# Patient Record
Sex: Female | Born: 1950 | Race: White | Hispanic: No | Marital: Married | Smoking: Former smoker
Health system: Southern US, Community
[De-identification: ages and names within clinical notes are randomized; demographics above are authoritative.]

## PROBLEM LIST (undated history)

## (undated) DIAGNOSIS — D689 Coagulation defect, unspecified: Secondary | ICD-10-CM

## (undated) DIAGNOSIS — T7840XA Allergy, unspecified, initial encounter: Secondary | ICD-10-CM

## (undated) DIAGNOSIS — E785 Hyperlipidemia, unspecified: Secondary | ICD-10-CM

## (undated) DIAGNOSIS — D0510 Intraductal carcinoma in situ of unspecified breast: Secondary | ICD-10-CM

## (undated) DIAGNOSIS — J189 Pneumonia, unspecified organism: Secondary | ICD-10-CM

## (undated) DIAGNOSIS — C9110 Chronic lymphocytic leukemia of B-cell type not having achieved remission: Secondary | ICD-10-CM

## (undated) DIAGNOSIS — M1990 Unspecified osteoarthritis, unspecified site: Secondary | ICD-10-CM

## (undated) DIAGNOSIS — C50919 Malignant neoplasm of unspecified site of unspecified female breast: Secondary | ICD-10-CM

## (undated) DIAGNOSIS — K219 Gastro-esophageal reflux disease without esophagitis: Secondary | ICD-10-CM

## (undated) HISTORY — DX: Allergy, unspecified, initial encounter: T78.40XA

## (undated) HISTORY — DX: Coagulation defect, unspecified: D68.9

## (undated) HISTORY — PX: CHOLECYSTECTOMY: SHX55

## (undated) HISTORY — PX: BILATERAL CARPAL TUNNEL RELEASE: SHX6508

## (undated) HISTORY — DX: Malignant neoplasm of unspecified site of unspecified female breast: C50.919

## (undated) HISTORY — DX: Chronic lymphocytic leukemia of B-cell type not having achieved remission: C91.10

## (undated) HISTORY — DX: Gastro-esophageal reflux disease without esophagitis: K21.9

## (undated) HISTORY — DX: Intraductal carcinoma in situ of unspecified breast: D05.10

## (undated) HISTORY — PX: EYE SURGERY: SHX253

## (undated) HISTORY — PX: BREAST LUMPECTOMY: SHX2

## (undated) HISTORY — PX: HERNIA REPAIR: SHX51

## (undated) HISTORY — DX: Hyperlipidemia, unspecified: E78.5

---

## 1977-09-15 HISTORY — PX: THYROID SURGERY: SHX805

## 2001-09-15 HISTORY — PX: GALLBLADDER SURGERY: SHX652

## 2002-09-15 HISTORY — PX: CARDIAC CATHETERIZATION: SHX172

## 2003-11-09 ENCOUNTER — Other Ambulatory Visit: Admission: RE | Admit: 2003-11-09 | Discharge: 2003-11-09 | Payer: Self-pay | Admitting: Obstetrics and Gynecology

## 2004-02-14 ENCOUNTER — Ambulatory Visit (HOSPITAL_COMMUNITY): Admission: RE | Admit: 2004-02-14 | Discharge: 2004-02-14 | Payer: Self-pay | Admitting: Gastroenterology

## 2004-02-14 ENCOUNTER — Encounter (INDEPENDENT_AMBULATORY_CARE_PROVIDER_SITE_OTHER): Payer: Self-pay | Admitting: Specialist

## 2004-09-15 DIAGNOSIS — D689 Coagulation defect, unspecified: Secondary | ICD-10-CM

## 2004-09-15 HISTORY — PX: KNEE ARTHROSCOPY: SUR90

## 2004-09-15 HISTORY — DX: Coagulation defect, unspecified: D68.9

## 2004-12-11 ENCOUNTER — Other Ambulatory Visit: Admission: RE | Admit: 2004-12-11 | Discharge: 2004-12-11 | Payer: Self-pay | Admitting: Obstetrics and Gynecology

## 2007-03-31 ENCOUNTER — Encounter (INDEPENDENT_AMBULATORY_CARE_PROVIDER_SITE_OTHER): Payer: Self-pay | Admitting: Gastroenterology

## 2007-03-31 ENCOUNTER — Ambulatory Visit (HOSPITAL_COMMUNITY): Admission: RE | Admit: 2007-03-31 | Discharge: 2007-03-31 | Payer: Self-pay | Admitting: Gastroenterology

## 2008-03-10 ENCOUNTER — Encounter: Admission: RE | Admit: 2008-03-10 | Discharge: 2008-03-10 | Payer: Self-pay | Admitting: Surgery

## 2008-04-20 ENCOUNTER — Ambulatory Visit (HOSPITAL_COMMUNITY): Admission: RE | Admit: 2008-04-20 | Discharge: 2008-04-20 | Payer: Self-pay | Admitting: Surgery

## 2009-04-17 ENCOUNTER — Encounter: Admission: RE | Admit: 2009-04-17 | Discharge: 2009-04-17 | Payer: Self-pay | Admitting: Obstetrics and Gynecology

## 2009-05-27 DIAGNOSIS — C9110 Chronic lymphocytic leukemia of B-cell type not having achieved remission: Secondary | ICD-10-CM | POA: Insufficient documentation

## 2009-06-08 ENCOUNTER — Encounter: Admission: RE | Admit: 2009-06-08 | Discharge: 2009-06-08 | Payer: Self-pay | Admitting: Surgery

## 2010-04-24 ENCOUNTER — Encounter: Admission: RE | Admit: 2010-04-24 | Discharge: 2010-04-24 | Payer: Self-pay | Admitting: Obstetrics and Gynecology

## 2010-04-26 ENCOUNTER — Encounter: Admission: RE | Admit: 2010-04-26 | Discharge: 2010-04-26 | Payer: Self-pay | Admitting: Obstetrics and Gynecology

## 2010-05-03 ENCOUNTER — Ambulatory Visit: Payer: Self-pay | Admitting: Oncology

## 2010-05-06 LAB — COMPREHENSIVE METABOLIC PANEL
ALT: 28 U/L (ref 0–35)
Albumin: 4.2 g/dL (ref 3.5–5.2)
BUN: 11 mg/dL (ref 6–23)
Chloride: 109 mEq/L (ref 96–112)
Creatinine, Ser: 0.8 mg/dL (ref 0.40–1.20)
Glucose, Bld: 110 mg/dL — ABNORMAL HIGH (ref 70–99)
Sodium: 142 mEq/L (ref 135–145)
Total Protein: 7 g/dL (ref 6.0–8.3)

## 2010-05-06 LAB — CBC WITH DIFFERENTIAL/PLATELET
EOS%: 2.1 % (ref 0.0–7.0)
HGB: 13.8 g/dL (ref 11.6–15.9)
LYMPH%: 70.7 % — ABNORMAL HIGH (ref 14.0–49.7)
MONO%: 4.4 % (ref 0.0–14.0)
NEUT#: 3.9 10*3/uL (ref 1.5–6.5)
NEUT%: 22.3 % — ABNORMAL LOW (ref 38.4–76.8)

## 2010-05-06 LAB — LACTATE DEHYDROGENASE: LDH: 108 U/L (ref 94–250)

## 2010-05-06 LAB — MORPHOLOGY: PLT EST: ADEQUATE

## 2010-05-06 LAB — CHCC SMEAR

## 2010-05-07 LAB — IGG, IGA, IGM: IgG (Immunoglobin G), Serum: 448 mg/dL — ABNORMAL LOW (ref 694–1618)

## 2010-05-10 ENCOUNTER — Ambulatory Visit (HOSPITAL_COMMUNITY): Admission: RE | Admit: 2010-05-10 | Discharge: 2010-05-10 | Payer: Self-pay | Admitting: Oncology

## 2010-06-27 ENCOUNTER — Ambulatory Visit: Payer: Self-pay | Admitting: Oncology

## 2010-07-01 ENCOUNTER — Other Ambulatory Visit: Admission: RE | Admit: 2010-07-01 | Discharge: 2010-07-01 | Payer: Self-pay | Admitting: Oncology

## 2010-07-01 LAB — CBC WITH DIFFERENTIAL/PLATELET
Basophils Absolute: 0.1 10*3/uL (ref 0.0–0.1)
Eosinophils Absolute: 0.5 10*3/uL (ref 0.0–0.5)
LYMPH%: 76.7 % — ABNORMAL HIGH (ref 14.0–49.7)
MCH: 31.3 pg (ref 25.1–34.0)
MCV: 91.5 fL (ref 79.5–101.0)
MONO#: 0.7 10*3/uL (ref 0.1–0.9)
MONO%: 3.7 % (ref 0.0–14.0)
NEUT#: 3 10*3/uL (ref 1.5–6.5)
NEUT%: 16.6 % — ABNORMAL LOW (ref 38.4–76.8)
RBC: 4.25 10*6/uL (ref 3.70–5.45)
RDW: 13.5 % (ref 11.2–14.5)
lymph#: 13.9 10*3/uL — ABNORMAL HIGH (ref 0.9–3.3)

## 2010-09-12 ENCOUNTER — Ambulatory Visit: Payer: Self-pay | Admitting: Oncology

## 2010-09-18 LAB — COMPREHENSIVE METABOLIC PANEL
ALT: 38 U/L — ABNORMAL HIGH (ref 0–35)
AST: 25 U/L (ref 0–37)
Albumin: 5 g/dL (ref 3.5–5.2)
Alkaline Phosphatase: 86 U/L (ref 39–117)
BUN: 13 mg/dL (ref 6–23)
CO2: 25 mEq/L (ref 19–32)
Calcium: 9.7 mg/dL (ref 8.4–10.5)
Chloride: 104 mEq/L (ref 96–112)
Creatinine, Ser: 0.7 mg/dL (ref 0.40–1.20)
Glucose, Bld: 89 mg/dL (ref 70–99)
Potassium: 4.1 mEq/L (ref 3.5–5.3)
Sodium: 140 mEq/L (ref 135–145)
Total Bilirubin: 1 mg/dL (ref 0.3–1.2)
Total Protein: 7 g/dL (ref 6.0–8.3)

## 2010-09-18 LAB — CBC WITH DIFFERENTIAL/PLATELET
BASO%: 0.2 % (ref 0.0–2.0)
Basophils Absolute: 0 10*3/uL (ref 0.0–0.1)
EOS%: 1.9 % (ref 0.0–7.0)
Eosinophils Absolute: 0.4 10*3/uL (ref 0.0–0.5)
HCT: 42.4 % (ref 34.8–46.6)
HGB: 14.5 g/dL (ref 11.6–15.9)
LYMPH%: 75.8 % — ABNORMAL HIGH (ref 14.0–49.7)
MCH: 31.4 pg (ref 25.1–34.0)
MCHC: 34.1 g/dL (ref 31.5–36.0)
MCV: 91.9 fL (ref 79.5–101.0)
MONO#: 0.8 10*3/uL (ref 0.1–0.9)
MONO%: 4.3 % (ref 0.0–14.0)
NEUT#: 3.3 10*3/uL (ref 1.5–6.5)
NEUT%: 17.8 % — ABNORMAL LOW (ref 38.4–76.8)
Platelets: 357 10*3/uL (ref 145–400)
RBC: 4.62 10*6/uL (ref 3.70–5.45)
RDW: 13.9 % (ref 11.2–14.5)
WBC: 18.7 10*3/uL — ABNORMAL HIGH (ref 3.9–10.3)
lymph#: 14.2 10*3/uL — ABNORMAL HIGH (ref 0.9–3.3)

## 2010-09-18 LAB — LACTATE DEHYDROGENASE: LDH: 123 U/L (ref 94–250)

## 2010-12-18 ENCOUNTER — Other Ambulatory Visit: Payer: Self-pay | Admitting: Oncology

## 2010-12-18 ENCOUNTER — Encounter (HOSPITAL_BASED_OUTPATIENT_CLINIC_OR_DEPARTMENT_OTHER): Payer: Federal, State, Local not specified - PPO | Admitting: Oncology

## 2010-12-18 DIAGNOSIS — Z79890 Hormone replacement therapy (postmenopausal): Secondary | ICD-10-CM

## 2010-12-18 DIAGNOSIS — F17200 Nicotine dependence, unspecified, uncomplicated: Secondary | ICD-10-CM

## 2010-12-18 DIAGNOSIS — C9110 Chronic lymphocytic leukemia of B-cell type not having achieved remission: Secondary | ICD-10-CM

## 2010-12-18 DIAGNOSIS — Z86718 Personal history of other venous thrombosis and embolism: Secondary | ICD-10-CM

## 2010-12-18 LAB — CBC WITH DIFFERENTIAL/PLATELET
Basophils Absolute: 0.1 10*3/uL (ref 0.0–0.1)
EOS%: 2 % (ref 0.0–7.0)
Eosinophils Absolute: 0.4 10*3/uL (ref 0.0–0.5)
HCT: 41.6 % (ref 34.8–46.6)
HGB: 13.6 g/dL (ref 11.6–15.9)
MCH: 29.3 pg (ref 25.1–34.0)
MCV: 89.7 fL (ref 79.5–101.0)
MONO#: 0.6 10*3/uL (ref 0.1–0.9)
MONO%: 3.1 % (ref 0.0–14.0)
NEUT%: 12.9 % — ABNORMAL LOW (ref 38.4–76.8)
RDW: 13.5 % (ref 11.2–14.5)
WBC: 20.3 10*3/uL — ABNORMAL HIGH (ref 3.9–10.3)
lymph#: 16.6 10*3/uL — ABNORMAL HIGH (ref 0.9–3.3)

## 2010-12-18 LAB — TECHNOLOGIST REVIEW

## 2011-01-28 NOTE — Op Note (Signed)
NAMEAMMIE, Colleen Moreno             ACCOUNT NO.:  [id number]   MEDICAL RECORD NO.:  [id number]          PATIENT TYPE:  AMB   LOCATION:  ENDO                         FACILITY:  [facility]   PHYSICIAN:  Anselmo Rod, M.D.  DATE OF BIRTH:  04/28/1951   DATE OF PROCEDURE:  03/31/2007  DATE OF DISCHARGE:                               OPERATIVE REPORT   PROCEDURE PERFORMED:  Colonoscopy with multiple cold biopsies and snare  polypectomy x 4.   ENDOSCOPIST:  Anselmo Rod, M.D.   INSTRUMENT USED:  Pentax video colonoscope.   INDICATION FOR PROCEDURE:  A 60 year old white female with a history of  colonic polyps undergoing a surveillance colonoscopy to rule out  recurrent polyps.   PREPROCEDURE PREPARATION:  Informed consent was procured from the  patient.  The patient had fasted for 8 hours prior to the procedure and  prepped with 2 Dulcolax pills, a bottle of magnesium citrate, and a  gallon of NuLYTELY the night prior to the procedure.  The risks and  benefits of the procedure, including a 10% miss rate of cancer and  polyps, were discussed with the patient as well.   PREPROCEDURE PHYSICAL:  VITAL SIGNS:  The patient had stable vital  signs.  NECK:  Supple.  CHEST:  Clear to auscultation.  S1, S2 regular.  ABDOMEN:  Soft with normal bowel sounds.   DESCRIPTION OF PROCEDURE:  The patient was placed in the left lateral  decubitus position and sedated with 100 mcg of Fentanyl and 8 mg of  Versed given intravenously in slow incremental doses.  Once the patient  was adequately sedate and maintained on low-flow oxygen and continuous  cardiac monitoring, the Pentax video colonoscope was advanced from the  rectum to the cecum.  There was some residual stool in the colon.  Multiple washes were done. The appendiceal orifice and the ileocecal  valve were visualized and photographed. The terminal ileum appeared  healthy and without lesions.  Two small sessile polyps were removed from  the  anal verge via cold biopsy forceps.  Another polyp was snared at 60  cm and one from 50 cm (hot snare x2).  Another polyp was biopsied from  30 and another smaller polyp was removed via snare from 30 cm as well.  An isolated diverticulum was seen in the left colon.  Retroflexion in  the rectum revealed small internal hemorrhoids. The patient tolerated  the procedure well without complications.   IMPRESSION:  1. Small internal hemorrhoid.  2. Multiple small sessile polyps removed from the left colon, some by      hot snare and some by cold biopsy forceps.  3. Two small sessile polyps removed from the rectum close to the anal      verge with the biopsy forceps.  4. Some residual stool in the colon, multiple washes done.  5. Normal-appearing transverse colon, right colon, cecum and terminal      ileum.   RECOMMENDATIONS:  1. Await pathology results.  2. Avoid all nonsteroidals including aspirin for the next 2 weeks.  3. Repeat colonoscopy depending on pathology results.  4. A high-fiber diet has been discussed with the patient in detail and      brochures on diverticulosis have been given to the patient for her      education.  5. Outpatient follow-up as the need arises in the future.      Anselmo Rod, M.D.  Electronically Signed     JNM/MEDQ  D:  03/31/2007  T:  04/01/2007  Job:  [id number]   cc:   Marcelino Duster L. Vincente Poli, M.D.  Fax: [phone]   Evelena Peat, M.D.

## 2011-01-28 NOTE — Op Note (Signed)
Colleen Moreno, Colleen Moreno             ACCOUNT NO.:  [id number]   MEDICAL RECORD NO.:  [id number]          PATIENT TYPE:  AMB   LOCATION:  DAY                          FACILITY:  [facility]   PHYSICIAN:  Ardeth Sportsman, MD     DATE OF BIRTH:  Oct 15, 1950   DATE OF PROCEDURE:  04/20/2008  DATE OF DISCHARGE:                               OPERATIVE REPORT   PRIMARY CARE PHYSICIAN:  Ace Gins, MD, [facility].   SURGEON:  Ardeth Sportsman, MD   ASSISTANT:  None.   PREOPERATIVE DIAGNOSIS:  Bilateral inguinal hernias with question of  left femoral hernia.   POSTOPERATIVE DIAGNOSIS:  Bilateral indirect inguinal hernias  Bilateral femoral hernias not incarcerated  Polyp in right hypopharynx.   PROCEDURE PERFORMED:  1. Laparoscopic preperitoneal exploration and lysis of adhesions.  2. Laparoscopic bilateral inguinal hernia repair.  3. Laparoscopic bilateral femoral inguinal hernia repair.   ANESTHESIA:  1. General anesthesia.  2. Local anesthetic in a field block around all port sites.  3. Bilateral ilioinguinal/genitofemoral/cord nerve blocks.   SPECIMENS:  None.   DRAINS:  None.   ESTIMATED BLOOD LOSS:  30 mL.   COMPLICATIONS:  No major complications.   INDICATIONS:  Colleen Moreno is a 60 year old obese female who has been  having worsening groin pain and pressure and was found on exam to have  bilateral inguinal hernias.  I was concerned about perhaps a femoral  etiology given the way the pain presented.  She had a CT scan that ruled  out any other etiologies for abdominal pain.   The anatomy and embryology of abdominal wall formation was explained.  Pathophysiology of inguinal femoral herniation was explained with its  risks of incarceration, strangulation, worsening and debilitating pain  and discomfort discussed.  Also discussed recommendations made for  laparoscopic preperitoneal exploration with repair of hernias as seen.  Risks, benefits and alternatives  discussed.  Questions answered and  agreed to proceed.   OPERATIVE FINDINGS:  She had bilateral indirect inguinal hernias with  moderate sized cord lipomas.  She had mild bilateral femoral hernia,  left larger than the right.  There was no evidence of any incarceration  of the femoral hernias.  She had some moderate preperitoneal adhesions  from a prior midline C-section surgery.   During intubation anesthesia noted a smooth polyp in the hypopharynx  that did not seem to be associated with the vocal cords.  Anesthesiologist wondered, there was some question of perhaps it was  coming near the epiglottis but it was separate itself.   DESCRIPTION OF PROCEDURE:  Informed consent was confirmed.  The patient  voided just prior to going to the operating room.  She had received IV  antibiotics just prior to surgery.  She had sequential compression  devices active during the entire case.  She underwent general anesthesia  without any difficulty.  She had her arms tucked supine.  noted the a  smooth polyp in the hypopharynx it did not seem to be assessed to the  vocal cords.  During intubation anesthesia noted there was a moderate  sized polyp -  about 10 x 12 x 12 mm in size - in the hypopharynx.  It  did not seem to be sitting on the vocal cords itself.  I could not tell  for certain if it was associated with the epiglottis.  There was some  question it possibly was according to the nurse anesthetist but the  anesthesiologist felt otherwise.  However, we were able to safely pass  an endotracheal tube and do general anesthesia without any difficulty.  The patient's abdomen was prepped and draped in sterile fashion,  clipped, prepped and draped in sterile fashion.   Entry was gained in the preperitoneal space through an infraumbilical  low midline incision through a prior infraumbilical incision.  Dissection was done and a nick was made in the rectus fascia just left  of midline.  There was a  small breach in the peritoneum but was able to  redirect a Hassan port in the preperitoneal just left of midline  posterior to the left rectus abdominis muscle.  Capnoperitoneum to 15  mmHg provided good abdominal wall insufflation.  Camera dissection was  used to help free the peritoneum off of bilateral lower quadrants.  There was some dense midline adhesions.  I was able to get a 5 mm port  placed in the left mid abdomen and blunt and sharp dissection was used  to help free the peritoneum off the midline adhesive structures.  In  doing that there was a breach in the peritoneum.  This was later closed  using a 3-0 Vicryl.  There was two small breaches in the peritoneum that  were later closed using 3-0 Vicryl intracorporeal laparoscopic suturing  technique to good result.  Further dissection was done to help free off  enough working space in the mid abdomen such that a 5 mm port could be  placed in the right mid abdomen.   Attention was turned towards the right side.  Peritoneum was able to be  freed off laterally and followed down into the inguinal region.  The  anterolateral bladder was able to be freed off the pelvic sidewall and  freed down to the level of the obturator foramen well.  Peritoneum could  be seen crawling up into the internal ring and this was freed off of a  moderate cord lipoma that was freed off.  In trying to free off the  peritoneum off the round ligament it ended up skeletonizing and nearly  ripping.  Therefore I used cautery for hemostasis on the round ligament  and completed transection since it was over halfway ripped.  I was able  to free the peritoneum back off as proximally as possible.  During  camera inspection I could see a subtle but definite right femoral hernia  as well running with the femoral vessels.   Attention was turned toward the left side.  Sharp dissection was used to  help free some of the peritoneum as there were a little more dense   adhesions in the left lower quadrant but I was able to free that off.  I  also had to free off the round ligament in a similar fashion.  Dissection was carried out in a mirror image fashion.  There was a  larger but still relatively small femoral hernia on the left side.  Again, there was a moderate cord lipoma and indirect hernia on that  side.  A 15 x 15 cm ultralight weight polypropylene (Ultrapro) mesh was  used for each side.  Mesh  was cut to a half-skull shape and positioned  such that a medial and inferior flap rested down on the true pelvis in-  between the lateral bladder wall and the lateral pelvic wall and down  around the level of the obturator foramen.  Mesh laid well medially and  superomedial corners effectively overlapped at the midline.  The mesh  laid well such that there was circumferential coverage around of about 3  inches from the internal ring and femoral rings with good coverage.  Lead points of the hernia sacs and cord lipomas were elevated cephalad  as capnoperitoneum was evacuated.  Ports were removed.  There was some  capnoperitoneum and this was evacuated as well.  Fascial defect was  closed using a zero Vicryl stitch.  Skin was closed using 4-0 Monocryl  stitch.   The patient was extubated and sent to the recovery room in stable  condition.  I discussed postoperative care with the patient just prior  to surgery and I will discuss it with the family afterwards.  I will try  and set up and see if we could have maybe ENT see her for this polyp  that she was not aware about beforehand.  Hopefully it is just a benign  thing but it will be safe to have it looked into on an outpatient basis.  There was no ENT available to see it right now so will set that up at a  later time.  I also recommended that she stop smoking.      Ardeth Sportsman, MD  Electronically Signed     SCG/MEDQ  D:  04/20/2008  T:  04/20/2008  Job:  [id number]

## 2011-03-26 ENCOUNTER — Encounter (HOSPITAL_BASED_OUTPATIENT_CLINIC_OR_DEPARTMENT_OTHER): Payer: Federal, State, Local not specified - PPO | Admitting: Oncology

## 2011-03-26 ENCOUNTER — Other Ambulatory Visit: Payer: Self-pay | Admitting: Oncology

## 2011-03-26 DIAGNOSIS — Z79890 Hormone replacement therapy (postmenopausal): Secondary | ICD-10-CM

## 2011-03-26 DIAGNOSIS — C9110 Chronic lymphocytic leukemia of B-cell type not having achieved remission: Secondary | ICD-10-CM

## 2011-03-26 DIAGNOSIS — F17200 Nicotine dependence, unspecified, uncomplicated: Secondary | ICD-10-CM

## 2011-03-26 DIAGNOSIS — Z86718 Personal history of other venous thrombosis and embolism: Secondary | ICD-10-CM

## 2011-03-26 LAB — COMPREHENSIVE METABOLIC PANEL
ALT: 20 U/L (ref 0–35)
Alkaline Phosphatase: 79 U/L (ref 39–117)
BUN: 10 mg/dL (ref 6–23)
Chloride: 108 mEq/L (ref 96–112)
Glucose, Bld: 96 mg/dL (ref 70–99)
Sodium: 143 mEq/L (ref 135–145)

## 2011-03-26 LAB — CBC WITH DIFFERENTIAL/PLATELET
Eosinophils Absolute: 0.4 10*3/uL (ref 0.0–0.5)
HCT: 43.1 % (ref 34.8–46.6)
LYMPH%: 80.8 % — ABNORMAL HIGH (ref 14.0–49.7)
MCH: 30.8 pg (ref 25.1–34.0)
MONO%: 4 % (ref 0.0–14.0)
NEUT#: 2.8 10*3/uL (ref 1.5–6.5)
NEUT%: 13.2 % — ABNORMAL LOW (ref 38.4–76.8)
RBC: 4.7 10*6/uL (ref 3.70–5.45)
WBC: 21.5 10*3/uL — ABNORMAL HIGH (ref 3.9–10.3)
lymph#: 17.4 10*3/uL — ABNORMAL HIGH (ref 0.9–3.3)

## 2011-05-09 ENCOUNTER — Other Ambulatory Visit: Payer: Self-pay | Admitting: Obstetrics and Gynecology

## 2011-05-09 DIAGNOSIS — R928 Other abnormal and inconclusive findings on diagnostic imaging of breast: Secondary | ICD-10-CM

## 2011-05-16 ENCOUNTER — Other Ambulatory Visit: Payer: Self-pay | Admitting: Obstetrics and Gynecology

## 2011-05-16 ENCOUNTER — Ambulatory Visit
Admission: RE | Admit: 2011-05-16 | Discharge: 2011-05-16 | Disposition: A | Discharge status: Home / Self Care | Payer: Federal, State, Local not specified - PPO | Source: Ambulatory Visit | Attending: Obstetrics and Gynecology | Admitting: Obstetrics and Gynecology

## 2011-05-16 DIAGNOSIS — R928 Other abnormal and inconclusive findings on diagnostic imaging of breast: Secondary | ICD-10-CM

## 2011-05-20 ENCOUNTER — Ambulatory Visit
Admission: RE | Admit: 2011-05-20 | Discharge: 2011-05-20 | Disposition: A | Discharge status: Home / Self Care | Payer: Federal, State, Local not specified - PPO | Source: Ambulatory Visit | Attending: Obstetrics and Gynecology | Admitting: Obstetrics and Gynecology

## 2011-05-20 DIAGNOSIS — R928 Other abnormal and inconclusive findings on diagnostic imaging of breast: Secondary | ICD-10-CM

## 2011-05-21 ENCOUNTER — Other Ambulatory Visit: Payer: Self-pay | Admitting: Obstetrics and Gynecology

## 2011-05-21 DIAGNOSIS — C50912 Malignant neoplasm of unspecified site of left female breast: Secondary | ICD-10-CM

## 2011-05-23 ENCOUNTER — Ambulatory Visit
Admission: RE | Admit: 2011-05-23 | Discharge: 2011-05-23 | Disposition: A | Discharge status: Home / Self Care | Payer: Federal, State, Local not specified - PPO | Source: Ambulatory Visit | Attending: Obstetrics and Gynecology | Admitting: Obstetrics and Gynecology

## 2011-05-23 DIAGNOSIS — C50912 Malignant neoplasm of unspecified site of left female breast: Secondary | ICD-10-CM

## 2011-05-23 MED ORDER — GADOBENATE DIMEGLUMINE 529 MG/ML IV SOLN
18.0000 mL | Freq: Once | INTRAVENOUS | Status: AC | PRN
  Administered 2011-05-23: 18 mL via INTRAVENOUS

## 2011-05-28 ENCOUNTER — Other Ambulatory Visit: Payer: Self-pay | Admitting: Oncology

## 2011-05-28 ENCOUNTER — Ambulatory Visit (HOSPITAL_BASED_OUTPATIENT_CLINIC_OR_DEPARTMENT_OTHER): Payer: Federal, State, Local not specified - PPO | Admitting: Surgery

## 2011-05-28 ENCOUNTER — Encounter (INDEPENDENT_AMBULATORY_CARE_PROVIDER_SITE_OTHER): Payer: Self-pay | Admitting: General Surgery

## 2011-05-28 ENCOUNTER — Encounter (HOSPITAL_BASED_OUTPATIENT_CLINIC_OR_DEPARTMENT_OTHER): Payer: Federal, State, Local not specified - PPO | Admitting: Oncology

## 2011-05-28 ENCOUNTER — Encounter (INDEPENDENT_AMBULATORY_CARE_PROVIDER_SITE_OTHER): Payer: Self-pay | Admitting: Surgery

## 2011-05-28 VITALS — BP 145/85 | HR 89 | Temp 98.7°F | Resp 20 | Ht 68.5 in | Wt 197.1 lb

## 2011-05-28 DIAGNOSIS — Z86718 Personal history of other venous thrombosis and embolism: Secondary | ICD-10-CM

## 2011-05-28 DIAGNOSIS — Z170 Estrogen receptor positive status [ER+]: Secondary | ICD-10-CM

## 2011-05-28 DIAGNOSIS — Z79890 Hormone replacement therapy (postmenopausal): Secondary | ICD-10-CM

## 2011-05-28 DIAGNOSIS — D0510 Intraductal carcinoma in situ of unspecified breast: Secondary | ICD-10-CM

## 2011-05-28 DIAGNOSIS — C50512 Malignant neoplasm of lower-outer quadrant of left female breast: Secondary | ICD-10-CM | POA: Insufficient documentation

## 2011-05-28 DIAGNOSIS — F17200 Nicotine dependence, unspecified, uncomplicated: Secondary | ICD-10-CM

## 2011-05-28 DIAGNOSIS — C9110 Chronic lymphocytic leukemia of B-cell type not having achieved remission: Secondary | ICD-10-CM

## 2011-05-28 DIAGNOSIS — D0590 Unspecified type of carcinoma in situ of unspecified breast: Secondary | ICD-10-CM

## 2011-05-28 HISTORY — DX: Intraductal carcinoma in situ of unspecified breast: D05.10

## 2011-05-28 LAB — COMPREHENSIVE METABOLIC PANEL
Albumin: 4.2 g/dL (ref 3.5–5.2)
CO2: 24 mEq/L (ref 19–32)
Glucose, Bld: 137 mg/dL — ABNORMAL HIGH (ref 70–99)
Sodium: 141 mEq/L (ref 135–145)
Total Bilirubin: 1 mg/dL (ref 0.3–1.2)
Total Protein: 7.2 g/dL (ref 6.0–8.3)

## 2011-05-28 LAB — CBC WITH DIFFERENTIAL/PLATELET
LYMPH%: 82.4 % — ABNORMAL HIGH (ref 14.0–49.7)
MCH: 30.8 pg (ref 25.1–34.0)
MCHC: 33.9 g/dL (ref 31.5–36.0)
MCV: 91 fL (ref 79.5–101.0)
RDW: 13.3 % (ref 11.2–14.5)
lymph#: 21.5 10*3/uL — ABNORMAL HIGH (ref 0.9–3.3)

## 2011-05-28 LAB — CANCER ANTIGEN 27.29: CA 27.29: 27 U/mL (ref 0–39)

## 2011-05-28 NOTE — Progress Notes (Signed)
Chief Complaint  Patient presents with  . Breast Cancer    HPI Colleen Moreno is a 60 y.o. female.  She recently had a mammogram which showed a focal area of calcifications in the 3:00 position of the left breast. This has been further evaluated with a needle core biopsy and MRI. She has high-grade DCIS, receptor positive. The area measures at maximum 1.8 cm.Marland Kitchen HPI  No past medical history on file.  No past surgical history on file.  No family history on file.  Social History History  Substance Use Topics  . Smoking status: Not on file  . Smokeless tobacco: Not on file  . Alcohol Use: Not on file    No Known Allergies  Current Outpatient Prescriptions  Medication Sig Dispense Refill  . cholecalciferol (VITAMIN D) 1000 UNITS tablet Take 1,000 Units by mouth daily.        . Prenatal Vit-Fe Psac Cmplx-FA (PRENATAL MULTIVITAMIN) 60-1 MG tablet Take 1 tablet by mouth daily with breakfast.        . vitamin E 800 UNIT capsule Take 800 Units by mouth daily.        Marland Kitchen atorvastatin (LIPITOR) 80 MG tablet       . buPROPion (WELLBUTRIN SR) 200 MG 12 hr tablet       . DEXILANT 60 MG capsule       . LOVAZA 1 G capsule         Review of Systems Review of Systemsher review of system has been noted and is positive for a history of CLL. She has also had an abnormal EKG and has had to have stress test done but no cardiac disease has been found.  Blood pressure 145/85, pulse 89, temperature 98.7 F (37.1 C), resp. rate 20, height 5' 8.5" (1.74 m), weight 197 lb 1.6 oz (89.404 kg).  Physical Exam Physical Exam GENERAL: The patient is alert, oriented, and generally healthy-appearing, NAD. Mood and affect are normal.  HEENT: The head is normocephalic, the eyes nonicteric, the pupils were round regular and equal. EOMs are normal. Pharynx normal. Dentition good.  NECK: The neck is supple and there are no masses or thyromegaly.  LUNGS: Normal respirations and clear to auscultation.  HEART:  Regular rhythm, with no murmurs rubs or gallops. Pulses are intact carotid dorsalis pedis and posterior tibial. No significant varicosities are noted.  BREASTS:the breasts are symmetric in size and shape. There is no palpable mass on either side. Skin and nipple and areolar areas look normal. There is a small ecchymosis in the 3:00 position of the left breast.  LYMPHATICS: There is no axillary or supraclavicular adenopathy on either side.  ABDOMEN: Soft, flat, and nontender. No masses or organomegaly is noted. No hernias are noted. Bowel sounds are normal.  EXTREMITIES: Good range of motion, no edema.   Data Reviewed I have reviewed her mammogram films and reports, MRI films and reports, and the pathology slides and reports.  Assessment    Clinical stage 0 left breast cancer 3:00 position lateral. History of abnormal EKG. History of CLL, apparently stable.    Plan    I think she is an excellent candidate for a guidewire localized lumpectomy. She will need a preoperative cardiac clearance. I have gone over the indications risks and complications with the patient. She does understand that we occasionally has an involved margin or find invasive disease. Either would lead to the possibility of a second operation to clear margins or evaluate lymph nodes. However, I don't  think at this time she needs a sentinel node evaluation. She would like to go ahead and schedule surgery.  I've also discussed the case with both medical and radiation oncology. We'll try to make arrangements for a cardiac clearance and follow that with scheduling surgery.       Starasia Sinko J 05/28/2011, 2:40 PM

## 2011-05-28 NOTE — Patient Instructions (Addendum)
We will schedule you for a "lumpectomy" to be done after a guide wire is placed just before surgery. We will check with your cardiologist before surgery.  We will give you a blood thinner shot just before surgery because of your history of blood clots. Stop any aspirin or blood thinners five days before surgery.  If you have any questions call my office - [phone]. My nurse is Lesly Rubenstein

## 2011-05-29 ENCOUNTER — Other Ambulatory Visit (INDEPENDENT_AMBULATORY_CARE_PROVIDER_SITE_OTHER): Payer: Self-pay | Admitting: Surgery

## 2011-05-29 DIAGNOSIS — C50919 Malignant neoplasm of unspecified site of unspecified female breast: Secondary | ICD-10-CM

## 2011-06-10 ENCOUNTER — Ambulatory Visit
Admission: RE | Admit: 2011-06-10 | Discharge: 2011-06-10 | Disposition: A | Discharge status: Home / Self Care | Payer: Federal, State, Local not specified - PPO | Source: Ambulatory Visit | Attending: Surgery | Admitting: Surgery

## 2011-06-10 ENCOUNTER — Other Ambulatory Visit (INDEPENDENT_AMBULATORY_CARE_PROVIDER_SITE_OTHER): Payer: Self-pay | Admitting: Surgery

## 2011-06-10 ENCOUNTER — Telehealth (INDEPENDENT_AMBULATORY_CARE_PROVIDER_SITE_OTHER): Payer: Self-pay | Admitting: General Surgery

## 2011-06-10 DIAGNOSIS — Z01811 Encounter for preprocedural respiratory examination: Secondary | ICD-10-CM

## 2011-06-10 NOTE — Telephone Encounter (Signed)
Called to speak with Dr Hazle Coca nurse. Patient actually saw Dr Alanda Amass for clearance and that nurse is JC. She will forward everything to him. They will check on stress test results and get a note of clearance to [place] today. They will fax results to [place] and leave me a voicemail since I am at our satellite office this afternoon.

## 2011-06-10 NOTE — Telephone Encounter (Signed)
Per JC left a message for me making me aware he just faxed clearance. Alisha to fax clearance to hospital. Patient made aware this is taken care of.

## 2011-06-11 ENCOUNTER — Ambulatory Visit
Admission: RE | Admit: 2011-06-11 | Discharge: 2011-06-11 | Disposition: A | Discharge status: Home / Self Care | Payer: Federal, State, Local not specified - PPO | Source: Ambulatory Visit | Attending: Surgery | Admitting: Surgery

## 2011-06-11 ENCOUNTER — Encounter (INDEPENDENT_AMBULATORY_CARE_PROVIDER_SITE_OTHER): Payer: Self-pay | Admitting: Surgery

## 2011-06-11 ENCOUNTER — Ambulatory Visit (HOSPITAL_BASED_OUTPATIENT_CLINIC_OR_DEPARTMENT_OTHER)
Admission: RE | Admit: 2011-06-11 | Discharge: 2011-06-11 | Disposition: A | Discharge status: Home / Self Care | Payer: Federal, State, Local not specified - PPO | Source: Ambulatory Visit | Attending: Surgery | Admitting: Surgery

## 2011-06-11 ENCOUNTER — Other Ambulatory Visit (INDEPENDENT_AMBULATORY_CARE_PROVIDER_SITE_OTHER): Payer: Self-pay | Admitting: Surgery

## 2011-06-11 DIAGNOSIS — D0590 Unspecified type of carcinoma in situ of unspecified breast: Secondary | ICD-10-CM | POA: Insufficient documentation

## 2011-06-11 DIAGNOSIS — C50919 Malignant neoplasm of unspecified site of unspecified female breast: Secondary | ICD-10-CM

## 2011-06-11 HISTORY — PX: MASTECTOMY PARTIAL / LUMPECTOMY: SUR851

## 2011-06-12 NOTE — Op Note (Addendum)
  Colleen Moreno, Colleen Moreno             ACCOUNT NO.:  [id number]  MEDICAL RECORD NO.:  [id number]  LOCATION:                                 FACILITY:  PHYSICIAN:  Currie Paris, M.D.   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  PROCEDURE:  Needle-guided left partial mastectomy.  SURGEON:  Currie Paris, MD  ANESTHESIA:  General.  CLINICAL HISTORY:  This patient initially presented with abnormality on mammogram and DCIS was found on biopsy.  After discussion of all the recommendation, she elected to proceed to a guidewire lumpectomy.  DESCRIPTION OF PROCEDURE:  I saw the patient in the holding area and she had no further questions.  We confirmed the plans for the procedure as noted above.  I marked the left breast.  I reviewed the mammogram localizing films.  The patient was taken to the operating room and after satisfactory general anesthesia had been obtained, the left breast was prepped and draped.  The time-out was performed.  The guidewire entered just into the lower outer quadrant and tracked a little bit superiorly and then towards the chest wall.  I made a horizontal transverse incision just about a centimeter above the guidewire entry site, which as I thought was going to be along the area where the clip marking the DCIS will be located.  I raised some skin flaps and then manipulated the guidewire into the wound and divided about a centimeter of breast tissue, which was primarily actually fatty tissue.  I then grasped the tract of the guidewire on either side with 2 Allis clamps and took a wide excision around the guidewire in all directions.  The most medial aspect was underneath the nipple-areolar area.  We did  get down to the chest wall.  The specimen mammogram showed the clip in the specimen and a density, which I thought represented probably post biopsy changes.  I made sure everything was dry.  We used either cautery or sutures.  I put clips in to mark the margins.  I closed the breast tissue with 3-0 Vicryl, 4-0 Monocryl, subcuticular, and some Dermabond.  The patient tolerated the procedure well and there were no complications.  All counts were correct.     Currie Paris, M.D.     CJS/MEDQ  D:  06/11/2011  T:  06/11/2011  Job:  [id number]  cc:   Drue Second, M.D. Billie Lade, Ph.D., M.D. Michelle L. Vincente Poli, M.D. Richard A. Alanda Amass, M.D.  Electronically Signed by Cyndia Bent M.D. on 06/25/2011 07:13:39 PM

## 2011-06-13 LAB — BASIC METABOLIC PANEL
BUN: 11
Calcium: 10.2
Creatinine, Ser: 0.7
GFR calc non Af Amer: >60
Glucose, Bld: 114 — ABNORMAL HIGH
Sodium: 143

## 2011-06-13 LAB — HEMOGLOBIN AND HEMATOCRIT, BLOOD: Hemoglobin: 14.1

## 2011-06-16 ENCOUNTER — Telehealth (INDEPENDENT_AMBULATORY_CARE_PROVIDER_SITE_OTHER): Payer: Self-pay | Admitting: General Surgery

## 2011-06-16 NOTE — Telephone Encounter (Signed)
Message copied by Liliana Cline on Mon Jun 16, 2011 10:34 AM ------      Message from: Currie Paris      Created: Mon Jun 16, 2011  9:30 AM       Tell the patient that her margins are OK. I will discuss in detail in the of

## 2011-06-16 NOTE — Telephone Encounter (Signed)
Patient made aware of path results. Will follow up at appt and call with any questions prior.  

## 2011-06-19 ENCOUNTER — Telehealth (INDEPENDENT_AMBULATORY_CARE_PROVIDER_SITE_OTHER): Payer: Self-pay | Admitting: General Surgery

## 2011-06-19 NOTE — Telephone Encounter (Signed)
Patient called to inquire about her radiation appt. Dr Jamey Ripa had said Colleen Moreno usually contacts patient after surgery if they were seen in the [facility]. The patient called because she has not heard from them. I called Colleen Moreno and left a message for her to call me back about the status.

## 2011-06-19 NOTE — Telephone Encounter (Signed)
Colleen Moreno has called and made patient an appt. I spoke with patient and she is aware of this.

## 2011-07-02 ENCOUNTER — Ambulatory Visit (INDEPENDENT_AMBULATORY_CARE_PROVIDER_SITE_OTHER): Payer: Federal, State, Local not specified - PPO | Admitting: Surgery

## 2011-07-02 ENCOUNTER — Encounter (INDEPENDENT_AMBULATORY_CARE_PROVIDER_SITE_OTHER): Payer: Self-pay | Admitting: Surgery

## 2011-07-02 VITALS — BP 120/78 | HR 84 | Temp 98.6°F | Resp 16 | Ht 69.0 in | Wt 198.6 lb

## 2011-07-02 DIAGNOSIS — D0510 Intraductal carcinoma in situ of unspecified breast: Secondary | ICD-10-CM

## 2011-07-02 DIAGNOSIS — D0590 Unspecified type of carcinoma in situ of unspecified breast: Secondary | ICD-10-CM

## 2011-07-02 NOTE — Progress Notes (Signed)
[facility]    [id number] 07/02/2011    12/28/1950   CC: Post op Left lumpectomy  HPI: The patient returns for post op follow-up. She underwent a left lumpectomy on 06/06/2011. Over all she feels that she is doing well. She has mild inciisional pain  PE: The incision is healing nicely and there is no evidence of infection or hematoma.    DATA REVIEWED: Pathology report showed DCIS negative marign  IMPRESSION: Patient doing well.   PLAN: Her next visit will be in three months, after she has finished radiation.

## 2011-07-02 NOTE — Patient Instructions (Signed)
See me again in about three months - three weeks or so after you have finished the radiation

## 2011-07-04 ENCOUNTER — Encounter: Payer: Federal, State, Local not specified - PPO | Admitting: Oncology

## 2011-07-07 ENCOUNTER — Ambulatory Visit
Admission: RE | Admit: 2011-07-07 | Discharge: 2011-07-07 | Disposition: A | Discharge status: Home / Self Care | Payer: Federal, State, Local not specified - PPO | Source: Ambulatory Visit | Attending: Radiation Oncology | Admitting: Radiation Oncology

## 2011-07-07 DIAGNOSIS — Z510 Encounter for antineoplastic radiation therapy: Secondary | ICD-10-CM | POA: Insufficient documentation

## 2011-07-07 DIAGNOSIS — C50919 Malignant neoplasm of unspecified site of unspecified female breast: Secondary | ICD-10-CM | POA: Insufficient documentation

## 2011-07-07 DIAGNOSIS — C9110 Chronic lymphocytic leukemia of B-cell type not having achieved remission: Secondary | ICD-10-CM | POA: Insufficient documentation

## 2011-07-10 ENCOUNTER — Other Ambulatory Visit: Payer: Self-pay | Admitting: Dermatology

## 2011-07-16 ENCOUNTER — Encounter: Payer: Self-pay | Admitting: *Deleted

## 2011-07-21 ENCOUNTER — Ambulatory Visit
Admission: RE | Admit: 2011-07-21 | Discharge: 2011-07-21 | Disposition: A | Discharge status: Home / Self Care | Payer: Federal, State, Local not specified - PPO | Source: Ambulatory Visit | Admitting: Radiation Oncology

## 2011-07-21 ENCOUNTER — Ambulatory Visit
Admission: RE | Admit: 2011-07-21 | Discharge: 2011-07-21 | Disposition: A | Discharge status: Home / Self Care | Payer: Federal, State, Local not specified - PPO | Source: Ambulatory Visit | Attending: Radiation Oncology | Admitting: Radiation Oncology

## 2011-07-22 ENCOUNTER — Ambulatory Visit
Admission: RE | Admit: 2011-07-22 | Discharge: 2011-07-22 | Disposition: A | Discharge status: Home / Self Care | Payer: Federal, State, Local not specified - PPO | Source: Ambulatory Visit | Attending: Radiation Oncology | Admitting: Radiation Oncology

## 2011-07-22 NOTE — Progress Notes (Signed)
CC:   Currie Paris, M.D. Drue Second, M.D. Michelle L. Vincente Poli, M.D.  DIAGNOSIS:  Left breast cancer.  NARRATIVE:  Mrs. Kuhar is seen today for weekly assessment.  She has completed 720 cGy of a planned 6300 cGy directed at the left breast area.  The patient is tolerating her treatments well at this time without any side effects with her treatment.  PHYSICAL EXAMINATION:  The patient's skin showed no appreciable radiation reaction.  Lungs:  Clear.  Heart:  Regular rhythm and rate.  IMPRESSION AND PLAN:  The patient is tolerating her radiation treatments well at this time.  The patient's radiation fields are setting up accurately.  The patient's radiation chart was checked today.  The plan is to continue with breast conservation therapy to a cumulative dose of 6300 cGy.    ______________________________ Billie Lade, Ph.D., M.D. JDK/MEDQ  D:  07/21/2011  T:  07/21/2011  Job:  [id number]

## 2011-07-23 ENCOUNTER — Ambulatory Visit
Admission: RE | Admit: 2011-07-23 | Discharge: 2011-07-23 | Disposition: A | Discharge status: Home / Self Care | Payer: Federal, State, Local not specified - PPO | Source: Ambulatory Visit | Attending: Radiation Oncology | Admitting: Radiation Oncology

## 2011-07-24 ENCOUNTER — Ambulatory Visit
Admission: RE | Admit: 2011-07-24 | Discharge: 2011-07-24 | Disposition: A | Discharge status: Home / Self Care | Payer: Federal, State, Local not specified - PPO | Source: Ambulatory Visit | Attending: Radiation Oncology | Admitting: Radiation Oncology

## 2011-07-25 ENCOUNTER — Ambulatory Visit
Admission: RE | Admit: 2011-07-25 | Discharge: 2011-07-25 | Disposition: A | Discharge status: Home / Self Care | Payer: Federal, State, Local not specified - PPO | Source: Ambulatory Visit | Attending: Radiation Oncology | Admitting: Radiation Oncology

## 2011-07-28 ENCOUNTER — Ambulatory Visit
Admission: RE | Admit: 2011-07-28 | Discharge: 2011-07-28 | Disposition: A | Discharge status: Home / Self Care | Payer: Federal, State, Local not specified - PPO | Source: Ambulatory Visit | Attending: Radiation Oncology | Admitting: Radiation Oncology

## 2011-07-29 ENCOUNTER — Ambulatory Visit
Admission: RE | Admit: 2011-07-29 | Discharge: 2011-07-29 | Disposition: A | Discharge status: Home / Self Care | Payer: Federal, State, Local not specified - PPO | Source: Ambulatory Visit | Attending: Radiation Oncology | Admitting: Radiation Oncology

## 2011-07-29 DIAGNOSIS — D0510 Intraductal carcinoma in situ of unspecified breast: Secondary | ICD-10-CM

## 2011-07-29 DIAGNOSIS — C9110 Chronic lymphocytic leukemia of B-cell type not having achieved remission: Secondary | ICD-10-CM

## 2011-07-29 MED ORDER — RADIAPLEXRX EX GEL
Freq: Once | CUTANEOUS | Status: AC
  Administered 2011-07-29: 11:00:00 via TOPICAL

## 2011-07-29 NOTE — Progress Notes (Signed)
DIAGNOSIS:  Left breast cancer.  NARRATIVE:  Colleen Moreno is seen today for weekly assessment.  She has completed 1500 cGy of a planned 6300 cGy directed at the left breast area.  The patient has noticed some fatigue, but attributes this to her diagnosis of CLL.  The patient denies any significant itching or discomfort in the breast area.  PHYSICAL EXAMINATION:  Breasts:  There is some erythema in the breast, but no significant radiation reaction is appreciated at this point. Lungs:  Clear.  Heart:  Has a regular rhythm and rate.  IMPRESSION AND PLAN:  The patient is tolerating her radiation treatments well at this time.  The patient's radiation fields are setting up accurately.  The patient's radiation chart was checked today.  Plan is to continue to a cumulative dose of 6300 cGy as breast conservation therapy.    ______________________________ Colleen Moreno, Ph.D., M.D. JDK/MEDQ  D:  07/29/2011  T:  07/29/2011  Job:  [id number]

## 2011-07-29 NOTE — Progress Notes (Signed)
Encounter addended by: Lowella Petties, RN on: 07/29/2011 12:13 PM<BR>     Documentation filed: Inpatient MAR, Orders

## 2011-07-29 NOTE — Progress Notes (Signed)
Pt left breast 10/35 tx , slight erythema, needs another radiaplex gel tube, no c/o pain, end of week a little burning  In breast, now getting fatigued as well some

## 2011-07-30 ENCOUNTER — Ambulatory Visit
Admission: RE | Admit: 2011-07-30 | Discharge: 2011-07-30 | Disposition: A | Discharge status: Home / Self Care | Payer: Federal, State, Local not specified - PPO | Source: Ambulatory Visit | Attending: Radiation Oncology | Admitting: Radiation Oncology

## 2011-07-31 ENCOUNTER — Ambulatory Visit
Admission: RE | Admit: 2011-07-31 | Discharge: 2011-07-31 | Disposition: A | Discharge status: Home / Self Care | Payer: Federal, State, Local not specified - PPO | Source: Ambulatory Visit | Attending: Radiation Oncology | Admitting: Radiation Oncology

## 2011-08-01 ENCOUNTER — Ambulatory Visit
Admission: RE | Admit: 2011-08-01 | Discharge: 2011-08-01 | Disposition: A | Discharge status: Home / Self Care | Payer: Federal, State, Local not specified - PPO | Source: Ambulatory Visit | Attending: Radiation Oncology | Admitting: Radiation Oncology

## 2011-08-02 ENCOUNTER — Ambulatory Visit
Admission: RE | Admit: 2011-08-02 | Discharge: 2011-08-02 | Disposition: A | Discharge status: Home / Self Care | Payer: Federal, State, Local not specified - PPO | Source: Ambulatory Visit | Attending: Radiation Oncology | Admitting: Radiation Oncology

## 2011-08-04 ENCOUNTER — Ambulatory Visit
Admission: RE | Admit: 2011-08-04 | Discharge: 2011-08-04 | Disposition: A | Discharge status: Home / Self Care | Payer: Federal, State, Local not specified - PPO | Source: Ambulatory Visit | Attending: Radiation Oncology | Admitting: Radiation Oncology

## 2011-08-04 VITALS — Wt 199.3 lb

## 2011-08-04 DIAGNOSIS — D0510 Intraductal carcinoma in situ of unspecified breast: Secondary | ICD-10-CM

## 2011-08-04 NOTE — Progress Notes (Signed)
Slight erythema, dermatitis just starting to come up on front of chest left side, no c/o pain, 9:53 AM  .

## 2011-08-04 NOTE — Progress Notes (Signed)
DIAGNOSIS:  Left breast cancer.  NARRATIVE:  Colleen Moreno is seen today for weekly assessment.  She has completed 2700 cGy of a planned 5040 cGy directed at the left breast area.  The patient did have some fatigue over the weekend with her 6 treatments last week.  She has also noticed some sensitivity within the left breast.  The patient is using her RadiaPlex skin moisturizer we have given her.  She has also been using a product that she lays on her skin that sounds like hydrogel.  I have asked the patient to bring this in for review.  This is very soothing for her.  PHYSICAL EXAMINATION:  There are some hyperpigmentation changes and erythema in the treatment area, but no skin breakdown is appreciated. The patient's lungs are clear.  The heart has a regular rhythm and rate.  IMPRESSION AND PLAN:  The patient is tolerating her treatments reasonably well except for issues as above.  The patient's radiation fields are setting up accurately.  The patient's radiation chart was checked today.  Plan is to continue to a cumulative dose of 6300 cGy.    ______________________________ Billie Lade, Ph.D., M.D. JDK/MEDQ  D:  08/04/2011  T:  08/04/2011  Job:  [id number]

## 2011-08-05 ENCOUNTER — Ambulatory Visit
Admission: RE | Admit: 2011-08-05 | Discharge: 2011-08-05 | Disposition: A | Discharge status: Home / Self Care | Payer: Federal, State, Local not specified - PPO | Source: Ambulatory Visit | Attending: Radiation Oncology | Admitting: Radiation Oncology

## 2011-08-06 ENCOUNTER — Ambulatory Visit
Admission: RE | Admit: 2011-08-06 | Discharge: 2011-08-06 | Disposition: A | Discharge status: Home / Self Care | Payer: Federal, State, Local not specified - PPO | Source: Ambulatory Visit | Attending: Radiation Oncology | Admitting: Radiation Oncology

## 2011-08-11 ENCOUNTER — Ambulatory Visit
Admission: RE | Admit: 2011-08-11 | Discharge: 2011-08-11 | Disposition: A | Discharge status: Home / Self Care | Payer: Federal, State, Local not specified - PPO | Source: Ambulatory Visit | Attending: Radiation Oncology | Admitting: Radiation Oncology

## 2011-08-12 ENCOUNTER — Ambulatory Visit
Admission: RE | Admit: 2011-08-12 | Discharge: 2011-08-12 | Disposition: A | Discharge status: Home / Self Care | Payer: Federal, State, Local not specified - PPO | Source: Ambulatory Visit | Attending: Radiation Oncology | Admitting: Radiation Oncology

## 2011-08-12 VITALS — Wt 200.0 lb

## 2011-08-12 DIAGNOSIS — D0510 Intraductal carcinoma in situ of unspecified breast: Secondary | ICD-10-CM

## 2011-08-12 NOTE — Progress Notes (Signed)
Weekly Management Note:  Site:L breast Current Dose:  3420 cGy Projected Dose: 6300  cGy  Narrative: The patient is seen today for routine under treatment assessment. CBCT/MVCT images/port films were reviewed. The chart was reviewed.   She is without complaints today. She is using Radioplex gel.  Physical Examination: There were no vitals filed for this visit..  Weight: 200 lb (90.719 kg). There is slight erythema the skin with no areas of desquamation.  Impression: Tolerating radiation therapy well.  Plan: Continue radiation therapy as planned.

## 2011-08-12 NOTE — Progress Notes (Signed)
Pt sleeping better, fatigue has set in stated pt, slight erythema, no skin breakdown, no c/o pain in breast just discomfort 11:35 AM

## 2011-08-13 ENCOUNTER — Ambulatory Visit
Admission: RE | Admit: 2011-08-13 | Discharge: 2011-08-13 | Disposition: A | Discharge status: Home / Self Care | Payer: Federal, State, Local not specified - PPO | Source: Ambulatory Visit | Attending: Radiation Oncology | Admitting: Radiation Oncology

## 2011-08-14 ENCOUNTER — Ambulatory Visit
Admission: RE | Admit: 2011-08-14 | Discharge: 2011-08-14 | Disposition: A | Discharge status: Home / Self Care | Payer: Federal, State, Local not specified - PPO | Source: Ambulatory Visit | Attending: Radiation Oncology | Admitting: Radiation Oncology

## 2011-08-14 ENCOUNTER — Ambulatory Visit: Payer: Federal, State, Local not specified - PPO

## 2011-08-15 ENCOUNTER — Ambulatory Visit: Payer: Federal, State, Local not specified - PPO

## 2011-08-15 ENCOUNTER — Ambulatory Visit
Admission: RE | Admit: 2011-08-15 | Discharge: 2011-08-15 | Disposition: A | Discharge status: Home / Self Care | Payer: Federal, State, Local not specified - PPO | Source: Ambulatory Visit | Attending: Radiation Oncology | Admitting: Radiation Oncology

## 2011-08-15 DIAGNOSIS — D0510 Intraductal carcinoma in situ of unspecified breast: Secondary | ICD-10-CM

## 2011-08-15 MED ORDER — RADIAPLEXRX EX GEL
Freq: Once | CUTANEOUS | Status: AC
  Administered 2011-08-15: 1 via TOPICAL

## 2011-08-15 NOTE — Progress Notes (Signed)
Pt needing another radiaplex gel, slight erythema, dr. [place] Scrape seen pt the 27th, no change, pt does have a congested cough, pt has allergies too, eyes are watering again  11:14 AM

## 2011-08-18 ENCOUNTER — Ambulatory Visit
Admission: RE | Admit: 2011-08-18 | Discharge: 2011-08-18 | Disposition: A | Discharge status: Home / Self Care | Payer: Federal, State, Local not specified - PPO | Source: Ambulatory Visit | Attending: Radiation Oncology | Admitting: Radiation Oncology

## 2011-08-19 ENCOUNTER — Ambulatory Visit
Admission: RE | Admit: 2011-08-19 | Discharge: 2011-08-19 | Disposition: A | Discharge status: Home / Self Care | Payer: Federal, State, Local not specified - PPO | Source: Ambulatory Visit | Attending: Radiation Oncology | Admitting: Radiation Oncology

## 2011-08-19 VITALS — Wt 203.1 lb

## 2011-08-19 DIAGNOSIS — D0510 Intraductal carcinoma in situ of unspecified breast: Secondary | ICD-10-CM

## 2011-08-19 NOTE — Progress Notes (Signed)
DIAGNOSIS:  Left breast cancer.  NARRATIVE:  Earlier today Colleen Moreno underwent additional planning for radiation therapy directed at the left breast area.  The patient's treatment planning CT scan was reviewed and she subsequently had setup of a boost field directed at the site of presentation in the upper-outer quadrant of the left breast.  Given the depth within the breast, reduced field photon beam arrangement was chosen for treatment.  The patient be treated with an AP and a left posterior oblique field.  A combination of 18 and 6 mV photons will be used to deliver the patient's treatment.  A computerized isodose plan will be generated for treatment.  TREATMENT PLAN:  The patient is to proceed with daily radiation treatments at 180 cGy per day for 7 treatments for an additional dose of 1260 cGy and a cumulative dose of 6300 cGy.    ______________________________ Billie Lade, Ph.D., M.D. JDK/MEDQ  D:  08/13/2011  T:  08/19/2011  Job:  [id number]

## 2011-08-19 NOTE — Progress Notes (Signed)
DIAGNOSIS:  Left breast cancer.  NARRATIVE:  Ms. Leber is seen today for weekly assessment.  She has completed 4320 cGy of a planned 6300 cGy directed at the left breast area.  The patient is having a lot of discomfort in the breast area at this time.  In light of this, the patient has been given Hydrogel dressings to place on her skin.  EXAMINATION:  General:  Patient's weight is as stable at 203.  Lungs: Examination of the lungs reveals them to be clear.  Heart:  Has a regular rhythm and rate.  Breasts:  Examination of the left breast reveals brisk erythema and some hyperpigmentation changes but no moist desquamation.  IMPRESSION AND PLAN:  The patient is tolerating her treatments reasonably well except for issues as above.  The patient's radiation fields are setting up accurately.  The patient's radiation chart was checked today.  The plan is to continue with breast conservation therapy to a cumulative dose of 6300 cGy.    ______________________________ Billie Lade, Ph.D., M.D. JDK/MEDQ  D:  08/19/2011  T:  08/19/2011  Job:  [id number]

## 2011-08-19 NOTE — Progress Notes (Signed)
Pt has bright erythema under left arm, c/o burning there and on left breast,rythema, will try gel pads, no itching,using radiaplex gel 11:15 AM

## 2011-08-19 NOTE — Progress Notes (Signed)
DIAGNOSIS:  Left breast cancer.  NARRATIVE:  Colleen Moreno is seen today for weekly assessment.  She has completed 22 out of 35 planned treatments directed at the left breast area (3960 cGy of a planned 6300 cGy).  The patient  is having some mild discomfort within the breast area but overall seems to be tolerating her treatments well.  She is using RadiaPlex as a skin moisturizer.  EXAMINATION:  Lungs:  The lungs are clear.  Heart:  The heart has a regular rhythm and rate.  Breasts:  Examination left breast reveals some hyperpigmentation changes and erythema but no signs of moist desquamation.  IMPRESSION AND PLAN:  The patient is tolerating her radiation treatments well.  At this time, the patient's radiation fields are setting up accurately.  The patient's radiation chart was checked today.  The plan is to continue to a cumulative dose of 6300 cGy.    ______________________________ Billie Lade, Ph.D., M.D. JDK/MEDQ  D:  08/15/2011  T:  08/19/2011  Job:  [id number]

## 2011-08-20 ENCOUNTER — Ambulatory Visit
Admission: RE | Admit: 2011-08-20 | Discharge: 2011-08-20 | Disposition: A | Discharge status: Home / Self Care | Payer: Federal, State, Local not specified - PPO | Source: Ambulatory Visit | Attending: Radiation Oncology | Admitting: Radiation Oncology

## 2011-08-21 ENCOUNTER — Ambulatory Visit
Admission: RE | Admit: 2011-08-21 | Discharge: 2011-08-21 | Disposition: A | Discharge status: Home / Self Care | Payer: Federal, State, Local not specified - PPO | Source: Ambulatory Visit | Attending: Radiation Oncology | Admitting: Radiation Oncology

## 2011-08-22 ENCOUNTER — Ambulatory Visit
Admission: RE | Admit: 2011-08-22 | Discharge: 2011-08-22 | Disposition: A | Discharge status: Home / Self Care | Payer: Federal, State, Local not specified - PPO | Source: Ambulatory Visit | Attending: Radiation Oncology | Admitting: Radiation Oncology

## 2011-08-25 ENCOUNTER — Ambulatory Visit
Admission: RE | Admit: 2011-08-25 | Discharge: 2011-08-25 | Disposition: A | Discharge status: Home / Self Care | Payer: Federal, State, Local not specified - PPO | Source: Ambulatory Visit | Attending: Radiation Oncology | Admitting: Radiation Oncology

## 2011-08-26 ENCOUNTER — Ambulatory Visit
Admission: RE | Admit: 2011-08-26 | Discharge: 2011-08-26 | Disposition: A | Discharge status: Home / Self Care | Payer: Federal, State, Local not specified - PPO | Source: Ambulatory Visit | Attending: Radiation Oncology | Admitting: Radiation Oncology

## 2011-08-26 VITALS — Wt 200.8 lb

## 2011-08-26 DIAGNOSIS — D0510 Intraductal carcinoma in situ of unspecified breast: Secondary | ICD-10-CM

## 2011-08-26 MED ORDER — RADIAPLEXRX EX GEL
Freq: Once | CUTANEOUS | Status: AC
  Administered 2011-08-26: 1 via TOPICAL

## 2011-08-26 NOTE — Progress Notes (Signed)
Pt has some peeling under left arm, using hydrogel pads helping, erythema bright on ret breast, needs another  radiaplex , some fatigue 11:35 AM

## 2011-08-26 NOTE — Procedures (Signed)
DIAGNOSIS:  Left breast cancer.  NARRATIVE:  Earlier today Mrs. Cassin underwent film verification of her reduced field setup.  The patient's isocenter was accurately placed and the multileaf  collimators contoured the target volume accurately on all fields imaged.    ______________________________ Billie Lade, Ph.D., M.D. JDK/MEDQ  D:  08/25/2011  T:  08/26/2011  Job:  [id number]

## 2011-08-26 NOTE — Progress Notes (Signed)
DIAGNOSIS:  Left breast cancer.  NARRATIVE:  Mrs. Geissinger is seen today for weekly assessment.  She has completed 5220 cGy of a planned 6300 cGy directed at the left breast area.  The patient is using Hydrogel dressings, which are soothing for her skin.  Overall, the patient seems be tolerating her treatments reasonably well except for these issues.  The patient has minimal fatigue.  PHYSICAL EXAMINATION:  There are hyperpigmentation changes and brisk erythema throughout much of the treatment area, but no moist desquamation is noted.  The lungs are clear.  The heart has regular rhythm and rate.  IMPRESSION AND PLAN:  The patient is tolerating her treatments reasonably well except for issues as above.  The patient's radiation fields are setting up accurately.  The patient's radiation chart was checked today.  The plan is to continue to a cumulative dose of 6300 cGy.    ______________________________ Billie Lade, Ph.D., M.D. JDK/MEDQ  D:  08/26/2011  T:  08/26/2011  Job:  [id number]

## 2011-08-27 ENCOUNTER — Ambulatory Visit
Admission: RE | Admit: 2011-08-27 | Discharge: 2011-08-27 | Disposition: A | Discharge status: Home / Self Care | Payer: Federal, State, Local not specified - PPO | Source: Ambulatory Visit | Attending: Radiation Oncology | Admitting: Radiation Oncology

## 2011-08-28 ENCOUNTER — Ambulatory Visit
Admission: RE | Admit: 2011-08-28 | Discharge: 2011-08-28 | Disposition: A | Discharge status: Home / Self Care | Payer: Federal, State, Local not specified - PPO | Source: Ambulatory Visit | Attending: Radiation Oncology | Admitting: Radiation Oncology

## 2011-08-29 ENCOUNTER — Ambulatory Visit
Admission: RE | Admit: 2011-08-29 | Discharge: 2011-08-29 | Disposition: A | Discharge status: Home / Self Care | Payer: Federal, State, Local not specified - PPO | Source: Ambulatory Visit | Attending: Radiation Oncology | Admitting: Radiation Oncology

## 2011-09-01 ENCOUNTER — Ambulatory Visit
Admission: RE | Admit: 2011-09-01 | Discharge: 2011-09-01 | Disposition: A | Discharge status: Home / Self Care | Payer: Federal, State, Local not specified - PPO | Source: Ambulatory Visit | Attending: Radiation Oncology | Admitting: Radiation Oncology

## 2011-09-02 ENCOUNTER — Ambulatory Visit
Admission: RE | Admit: 2011-09-02 | Discharge: 2011-09-02 | Disposition: A | Discharge status: Home / Self Care | Payer: Federal, State, Local not specified - PPO | Source: Ambulatory Visit | Attending: Radiation Oncology | Admitting: Radiation Oncology

## 2011-09-02 VITALS — Wt 201.2 lb

## 2011-09-02 DIAGNOSIS — D0510 Intraductal carcinoma in situ of unspecified breast: Secondary | ICD-10-CM

## 2011-09-02 NOTE — Progress Notes (Signed)
Slight peeling  Under left armpit, not using hydrogel pads anymore, 1 more tx, slight erythema rest breast, f/u appt card given 10:59 AM

## 2011-09-02 NOTE — Progress Notes (Signed)
DIAGNOSIS:  Left breast cancer.  NARRATIVE:  Colleen Moreno is seen today for weekly assessment.  She has completed 6120 cGy of a planned 6300 cGy directed at the left breast area.  The patient overall has tolerated her treatments well.  She is accompanied by her husband on evaluation today.  EXAMINATION:  There are hyperpigmentation changes and dry desquamation in the treatment area, but no moist desquamation at this time.  IMPRESSION AND PLAN:  The patient is tolerating her treatments well at this time.  The patient's radiation fields are setting up accurately. The patient's radiation chart was checked today.  The patient will continue with 1 more treatment to complete her planned course of therapy.    ______________________________ Billie Lade, Ph.D., M.D. JDK/MEDQ  D:  09/02/2011  T:  09/02/2011  Job:  [id number]

## 2011-09-03 ENCOUNTER — Ambulatory Visit (HOSPITAL_BASED_OUTPATIENT_CLINIC_OR_DEPARTMENT_OTHER): Payer: Federal, State, Local not specified - PPO | Admitting: Oncology

## 2011-09-03 ENCOUNTER — Other Ambulatory Visit: Payer: Self-pay | Admitting: Oncology

## 2011-09-03 ENCOUNTER — Ambulatory Visit
Admission: RE | Admit: 2011-09-03 | Discharge: 2011-09-03 | Disposition: A | Discharge status: Home / Self Care | Payer: Federal, State, Local not specified - PPO | Source: Ambulatory Visit | Attending: Radiation Oncology | Admitting: Radiation Oncology

## 2011-09-03 ENCOUNTER — Other Ambulatory Visit (HOSPITAL_BASED_OUTPATIENT_CLINIC_OR_DEPARTMENT_OTHER): Payer: Federal, State, Local not specified - PPO | Admitting: Lab

## 2011-09-03 VITALS — BP 120/78 | HR 86 | Temp 97.7°F | Wt 200.7 lb

## 2011-09-03 DIAGNOSIS — Z923 Personal history of irradiation: Secondary | ICD-10-CM

## 2011-09-03 DIAGNOSIS — F17200 Nicotine dependence, unspecified, uncomplicated: Secondary | ICD-10-CM

## 2011-09-03 DIAGNOSIS — D0590 Unspecified type of carcinoma in situ of unspecified breast: Secondary | ICD-10-CM

## 2011-09-03 DIAGNOSIS — C9110 Chronic lymphocytic leukemia of B-cell type not having achieved remission: Secondary | ICD-10-CM

## 2011-09-03 DIAGNOSIS — Z170 Estrogen receptor positive status [ER+]: Secondary | ICD-10-CM

## 2011-09-03 DIAGNOSIS — Z86718 Personal history of other venous thrombosis and embolism: Secondary | ICD-10-CM

## 2011-09-03 DIAGNOSIS — C50919 Malignant neoplasm of unspecified site of unspecified female breast: Secondary | ICD-10-CM

## 2011-09-03 LAB — COMPREHENSIVE METABOLIC PANEL
ALT: 25 U/L (ref 0–35)
AST: 22 U/L (ref 0–37)
Chloride: 106 mEq/L (ref 96–112)
Creatinine, Ser: 0.76 mg/dL (ref 0.50–1.10)
Total Bilirubin: 0.8 mg/dL (ref 0.3–1.2)

## 2011-09-03 LAB — CBC WITH DIFFERENTIAL/PLATELET
BASO%: 0.7 % (ref 0.0–2.0)
EOS%: 3.1 % (ref 0.0–7.0)
HCT: 44.1 % (ref 34.8–46.6)
LYMPH%: 66.8 % — ABNORMAL HIGH (ref 14.0–49.7)
MCH: 30.9 pg (ref 25.1–34.0)
MCHC: 33.6 g/dL (ref 31.5–36.0)
NEUT%: 23.6 % — ABNORMAL LOW (ref 38.4–76.8)
lymph#: 6.8 10*3/uL — ABNORMAL HIGH (ref 0.9–3.3)

## 2011-09-03 MED ORDER — EXEMESTANE 25 MG PO TABS: 25.0000 mg | ORAL_TABLET | Freq: Every day | ORAL | Status: AC

## 2011-09-03 NOTE — Procedures (Signed)
NARRATIVE:  On August 25, 2011, Ms. Colleen Moreno underwent film verification of her setup directed at the left breast.  The patient's isocenter was accurately placed, and the multileaf collimators contoured the target volume accurately on her boost field setup.    ______________________________ Billie Lade, Ph.D., M.D. JDK/MEDQ  D:  09/03/2011  T:  09/03/2011  Job:  [id number]

## 2011-09-03 NOTE — Progress Notes (Signed)
CC:   Currie Paris, M.D. Drue Second, M.D. Michelle L. Vincente Poli, M.D.  DIAGNOSIS:  Intraductal carcinoma of the left breast.  INDICATION FOR THERAPY:  Breast conservation.  TREATMENT DATES:  July 16, 2011 through September 03, 2011.  SITES/DOSE:  Left breast, 5040 cGy in 28 fractions (180 cGy per fraction).  The site of presentation in the upper outer aspect of the breast area was boosted further to a cumulative dose of 6300 cGy.  ENERGY/FIELD:  The patient was initially treated with tangential beams encompassing the left breast. A combination of 6 and 10 MV photons were used to deliver the patient's treatment.  After 28 treatments the patient underwent therapy directed at the site of presentation in the upper outer quadrant of the left breast.  The patient was treated with a reduced field photon beam arrangement in light of the depth within the breast.  The patient was treated with an AP and left posterior oblique field.  The patient received an additional dose of 1260 cGy with her boost field and a cumulative dose to the target area of 6300 cGy.  NARRATIVE:  Mrs. Hilyer overall tolerated her radiation therapy quite well.  She had minimal discomfort and pruritus within the breast as well as minimal fatigue during the course of her treatment.  The patient develop some dry desquamation, but no moist desquamation.  FOLLOW UP APPOINTMENT:  One month.    ______________________________ Billie Lade, Ph.D., M.D. JDK/MEDQ  D:  09/03/2011  T:  09/03/2011  Job:  [id number]

## 2011-09-03 NOTE — Progress Notes (Signed)
CC: Michelle L. Vincente Poli, M.D.  Currie Paris, M.D.  Billie Lade, Ph.D., M.D.    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  The patient is status post left breast core needle biopsy on 05/20/2011.  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  HISTORY OF PRESENT ILLNESS:  The patient is a very pleasant 60 year old Caucasian female from [place], [place]. She has previous oncologic history significant for stage I chronic lymphocytic leukemia, small lymphocytic lymphoma. She was worked up by Dr. Jethro Bolus, one of my partners. She was on watchful observation only. Most recently patient presented for screening mammograms that showed calcifications.  Because of this, she went on to have diagnostic mammogram and a left breast ultrasound on 05/16/2011.  The ultrasound showed near the level of the nipple a cluster of heterogeneous calcifications spanning 11 x 10 x 15 mm.  She was recommended a stereotactic biopsy. This was performed on May 20, 2011.  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  Associated with the in situ carcinoma, there was a chronic inflammation with monotonous lymph node population. Given the history of CLL, additional stains were to be performed to exclude involvement by CLL.  Additional stains eventually were performed showing a preponderance of CD 20 positive B cells that co-expressed CD 5 consistent with CLL.  The tumor was estrogen-receptor positive 76%, progesterone receptor positive 86%.  She is now seen in the [facility] for discussion of treatment options. The patient was seen in consultation with Dr. Cyndia Bent and Dr. Antony Blackbird.  PAST SURGICAL HISTORY:  The patient had hernia repair in  2009 by Dr. Tilden Dome. She has also had knee surgery in 2006 by Dr. Charlann Boxer. [place]  PAST MEDICAL HISTORY:  Significant for history of blood clot in 2006. This was after her gallbladder surgery. She also was diagnosed with CLL in August, 2011. She has high cholesterol and hiatal hernia.   CURRENT MEDICATIONS:  Include: 1. Lovaza 1 gram 2 times a day. 2. Dexalide 60 mg daily. 3. Atorvastatin. 4. Bupropion 200 mg daily. 5. Zyrtec. 6. Vitamin E.  7. B12. 8. B6 9. Osteoflex. 10. Prenatal vitamins.  ALLERGIES:  Patient does have seasonal pollen allergies.   FAMILY HISTORY:  The patient's father died at the age of 54 from complications of alcoholic cirrhosis. The patient's mother is alive and well at 66.  The patient is an only child. The patient's mom did have bilateral radical mastectomies. It is unclear whether she had cancer or not. The patient also had a maternal grandmother who had ovarian cancer in her early 47s.  There is no other history of breast cancer, ovarian cancers or endometrial cancers.  SOCIAL HISTORY:  The patient is retired.   She is accompanied today by her husband, Keyleigh Manninen. He calls himself "Shep". They have two boys who are twins, ages 38. They are identical twins and they both live in [place], [place]. The patient does smoke one pack a day, and she continues to smoke, but she does not drink alcohol.  GYN HISTORY:  Menarche at age 69. Menopause in 2000.  She was on hormone replacement therapy for three weeks and subsequently developed  blood clots post cholecystectomy.  OBSTETRICAL HISTORY:  The patient has had one pregnancy with two births at the age of 42.  HEALTH MAINTENANCE:  The patient began having colonoscopies, and her last colonoscopy was in 2009. She has had a bone density scan in 2011. Her last Pap smear was in August, 2012 and her first mammogram began in 1990s.  PERFORMANCE  STATUS:  ECOG performance status is 0.  PRIOR THERAPY 1. 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  She is now status post left lumpectomy with the final pathology revealing a high-grade ductal carcinoma in situ associated with biopsy site reaction, margins not involved.  The tumor was estrogen receptor-positive 76%, progesterone receptor-positive 86%. 2. Stage 0 CLL, originally diagnosed in August 2011. 3. Status Post radiation to the left breast completed on 09/03/11  INTERVAL HISTORY: patient is seen in follow up today. Overall she is doing well without any significant complaints. She completed her radiation therapy as of today 09/03/2011. She tolerated it very well without any significant complaints except for some redness of the skin there was no moist desquamation noted. She denies any nausea vomiting fevers chills night sweats headaches shortness of breath chest pains palpitations. Remainder of the 10 point review of systems is negative.  PHYSICAL EXAMINATION:  General:  The patient is awake, alert and in no acute distress. She appears well.    Vital Signs:   Filed Vitals:   09/03/11 1407  BP: 120/78  Pulse: 86  Temp: 97.7 F (36.5 C)      HEENT exam:  EOMI. PERRLA. Sclerae is anicteric. No conjunctival pallor. Oral mucosa is moist. Neck is supple. Lungs are clear bilaterally to auscultation and percussion.  Cardiovascular:  Regular rate and rhythm, no murmurs, gallops or rubs.  Abdomen is soft, nontender, nondistended. Bowel sounds are present.  No hepatosplenomegaly. Extremities: No clubbing, edema, or cyanosis.  Neuro: The patient is alert, oriented times 3.  DTRs are plus 4. Motor and sensory is intact.  Strength is symmetrical in upper and lower extremities.  Breast exam: Right breast, no masses or nipple discharge.  No nodularity.left breast reveals a well-healed lumpectomy scar there is changes in the skin  with redness and some peeling in the axillary region at the sentinel node site. Otherwise no nipple discharge no palpable masses.  LABORATORY DATA:   Lab Results  Component Value Date   WBC 10.2 09/03/2011   HGB 14.8 09/03/2011   HCT 44.1 09/03/2011   MCV 92.0 09/03/2011   PLT 283 09/03/2011   IMPRESSION AND PLAN:  A 60 year old female with: 1. 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  The tumor is estrogen-receptor positive, progesterone-receptor positive, 76% and 86% respectively.  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  2. Patient has now gone on to receive radiation therapy to the left breast which she completed on 09/03/2011. Overall she has done well with it. 3. 2 Will now start taking Aromasin 25 mg daily. A prescription was sent to her pharmacy. Risks and benefits of treatment were discussed with her. She understands and will comply with the instructions. 4. CLL is pretty stable in fact her white count is 10.2 although she still continues to have a lymphocytosis. We'll continue to monitor this. 5. All questions are answered today I spent  30 minutes with the patient greater than 50% of the time was in counseling and coordination of care per 6. Patient will followup with me in 3 months time for toxicity evaluation. However if she starts having problems with the Aromasin sooner she knows to call me sooner and I can see her. 7. All questions were answered today.    Drue Second, MD Medical/Oncology [facility] [phone] (beeper) [phone] (Office)  09/03/2011, 3:22 PM

## 2011-09-03 NOTE — Patient Instructions (Signed)
Exemestane tablets What is this medicine? EXEMESTANE (ex e MES tane) blocks the production of the hormone estrogen. Some types of breast cancer depend on estrogen to grow, and this medicine can stop tumor growth by blocking estrogen production. This medicine is for the treatment of breast cancer in postmenopausal women only. This medicine may be used for other purposes; ask your health care provider or pharmacist if you have questions. What should I tell my health care provider before I take this medicine? They need to know if you have any of these conditions: -an unusual or allergic reaction to exemestane, other medicines, foods, dyes, or preservatives -pregnant or trying to get pregnant -breast-feeding How should I use this medicine? Take this medicine by mouth with a glass of water. Follow the directions on the prescription label. Take your doses at regular intervals after a meal. Do not take your medicine more often than directed. Do not stop taking except on the advice of your doctor or health care professional. Contact your pediatrician regarding the use of this medicine in children. Special care may be needed. Overdosage: If you think you have taken too much of this medicine contact a poison control center or emergency room at once. NOTE: This medicine is only for you. Do not share this medicine with others. What if I miss a dose? If you miss a dose, take the next dose as usual. Do not try to make up the missed dose. Do not take double or extra doses. What may interact with this medicine? Do not take this medicine with any of the following medications: -female hormones, like estrogens and birth control pills This medicine may also interact with the following medications: -androstenedione -phenytoin -rifabutin, rifampin, or rifapentine -St. John's Wort This list may not describe all possible interactions. Give your health care provider a list of all the medicines, herbs,  non-prescription drugs, or dietary supplements you use. Also tell them if you smoke, drink alcohol, or use illegal drugs. Some items may interact with your medicine. What should I watch for while using this medicine? Visit your doctor or health care professional for regular checks on your progress. If you experience hot flashes or sweating while taking this medicine, avoid alcohol, smoking and drinks with caffeine. This may help to decrease these side effects. What side effects may I notice from receiving this medicine? Side effects that you should report to your doctor or health care professional as soon as possible: -any new or unusual symptoms -changes in vision -fever -leg or arm swelling -pain in bones, joints, or muscles -pain in hips, back, ribs, arms, shoulders, or legs Side effects that usually do not require medical attention (report to your doctor or health care professional if they continue or are bothersome): -difficulty sleeping -headache -hot flashes -sweating -unusually weak or tired This list may not describe all possible side effects. Call your doctor for medical advice about side effects. You may report side effects to FDA at 1-800-FDA-1088. Where should I keep my medicine? Keep out of the reach of children. Store at room temperature between 15 and 30 degrees C (59 and 86 degrees F). Throw away any unused medicine after the expiration date. NOTE: This sheet is a summary. It may not cover all possible information. If you have questions about this medicine, talk to your doctor, pharmacist, or health care provider.  2012, [employer]vier/Gold Standard. (01/04/2008 11:48:29 AM)

## 2011-09-04 ENCOUNTER — Ambulatory Visit: Payer: Federal, State, Local not specified - PPO

## 2011-09-08 ENCOUNTER — Encounter (INDEPENDENT_AMBULATORY_CARE_PROVIDER_SITE_OTHER): Payer: Self-pay | Admitting: Surgery

## 2011-10-06 ENCOUNTER — Ambulatory Visit
Admission: RE | Admit: 2011-10-06 | Discharge: 2011-10-06 | Disposition: A | Discharge status: Home / Self Care | Payer: Federal, State, Local not specified - PPO | Source: Ambulatory Visit | Attending: Radiation Oncology | Admitting: Radiation Oncology

## 2011-10-06 DIAGNOSIS — D0510 Intraductal carcinoma in situ of unspecified breast: Secondary | ICD-10-CM

## 2011-10-06 NOTE — Progress Notes (Signed)
CC:   Drue Second, M.D. Currie Paris, M.D. Michelle L. Vincente Poli, M.D.  DIAGNOSIS:  Intraductal carcinoma of the left breast.  INTERVAL SINCE RADIATION THERAPY:  One month.  NARRATIVE:  Ms. Huguley comes in today for routine followup.  She clinically seems to be doing reasonably well.  She has some mild fatigue.  She has noticed some mild irritability and mild problems with hot flashes related to starting Aromasin.  The patient has been on this medication approximately a month.  The patient denies any consistent pain in the left breast, nipple discharge or bleeding.  PHYSICAL EXAMINATION:  The patient's temperature is 98.3, pulse is 78, blood pressure is 121/75.  Weight is 197.3 pounds.  Examination of the neck and supraclavicular region reveals no evidence of adenopathy.  The axillary areas are free of adenopathy.  Examination of the lungs reveals them to be clear.  The heart has a regular rhythm and rate.  Examination of the left breast reveals some mild edema.  There are some hyperpigmentation changes noted.  There is no dominant mass appreciated in the breast, nipple discharge or bleeding.  IMPRESSION AND PLAN:  The patient is doing well at this time.  The patient will be on hormonal therapy under the direction of Dr. Welton Flakes. Routine followup in radiation oncology in 6 months.    ______________________________ Billie Lade, Ph.D., M.D. JDK/MEDQ  D:  10/06/2011  T:  10/06/2011  Job:  [id number]

## 2011-10-06 NOTE — Progress Notes (Signed)
Here for one month follow up post radiation post treatment to left breast.tolerated well. Has started aromasin 25 mg daily. No side effects. Skin has healed.

## 2011-12-01 ENCOUNTER — Other Ambulatory Visit (HOSPITAL_BASED_OUTPATIENT_CLINIC_OR_DEPARTMENT_OTHER): Payer: Federal, State, Local not specified - PPO | Admitting: Lab

## 2011-12-01 ENCOUNTER — Telehealth: Payer: Self-pay | Admitting: *Deleted

## 2011-12-01 ENCOUNTER — Encounter: Payer: Self-pay | Admitting: Oncology

## 2011-12-01 ENCOUNTER — Ambulatory Visit (HOSPITAL_BASED_OUTPATIENT_CLINIC_OR_DEPARTMENT_OTHER): Payer: Federal, State, Local not specified - PPO | Admitting: Oncology

## 2011-12-01 VITALS — BP 121/80 | HR 78 | Temp 97.7°F | Ht 68.0 in | Wt 195.0 lb

## 2011-12-01 DIAGNOSIS — Z170 Estrogen receptor positive status [ER+]: Secondary | ICD-10-CM

## 2011-12-01 DIAGNOSIS — C9110 Chronic lymphocytic leukemia of B-cell type not having achieved remission: Secondary | ICD-10-CM

## 2011-12-01 DIAGNOSIS — D0590 Unspecified type of carcinoma in situ of unspecified breast: Secondary | ICD-10-CM

## 2011-12-01 DIAGNOSIS — C50919 Malignant neoplasm of unspecified site of unspecified female breast: Secondary | ICD-10-CM

## 2011-12-01 DIAGNOSIS — D0510 Intraductal carcinoma in situ of unspecified breast: Secondary | ICD-10-CM

## 2011-12-01 LAB — CBC WITH DIFFERENTIAL/PLATELET
BASO%: 0.7 % (ref 0.0–2.0)
EOS%: 2.9 % (ref 0.0–7.0)
HCT: 43.4 % (ref 34.8–46.6)
LYMPH%: 63 % — ABNORMAL HIGH (ref 14.0–49.7)
MCH: 31.2 pg (ref 25.1–34.0)
MCHC: 33.4 g/dL (ref 31.5–36.0)
NEUT%: 28.3 % — ABNORMAL LOW (ref 38.4–76.8)
Platelets: 334 10*3/uL (ref 145–400)

## 2011-12-01 NOTE — Telephone Encounter (Signed)
gave patient appointment for 06-02-2012 starting with labs at 11:00am printed out calendar and gave to the patinet

## 2011-12-01 NOTE — Patient Instructions (Signed)
1. You are doing well.  2. Continue taking your aromasin as you are.  3. I will see you back in 6 months (06/02/12) with blood work

## 2011-12-01 NOTE — Progress Notes (Signed)
OFFICE PROGRESS NOTE  CC  [name], MD, MD [address] [place] [address] Dr. Jeanene Erb Dr. Cyndia Bent Dr. 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  PRIOR THERAPY:  #1 patient is status post left lumpectomy that showed a final pathology with high-grade ductal carcinoma in situ with associated biopsy site reaction margins were not involved tumor was estrogen receptor +76% progesterone receptor +86%.  #2 patient then went on to receive radiation therapy to the left breast by Dr. Fayrene Fearing kinder.  #3 patient was then begun on adjuvant antiestrogen therapy consisting of Aromasin 25 mg daily. A total of 5 years therapy is planned. Her treatment was started in December 2012.  CURRENT THERAPY: Aromasin 25 mg daily since December 2012  INTERVAL HISTORY: Colleen Moreno 61 y.o. female returns for followup visit today. She was last seen about 3 months ago. Overall she is doing well she is tolerating Aromasin quite nicely without any significant complaints or problems she is denying any aches pains hot flashes no vaginal discharge or bleeding she has no nausea vomiting fevers chills night sweats no myalgias or arthralgias. She has no changes in her bowel or bladder habits. She has been trying to exercise. And she is continuing to reduce the amount of cigarettes that she is smoking. Remainder of the 10 point review of systems is negative.  MEDICAL HISTORY: Past Medical History  Diagnosis Date  . Clotting disorder 2006    blood clot  . 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 Allergy   . Hyperlipidemia   . CLL (chronic lymphoblastic leukemia)   . Breast cancer     left  . GERD (gastroesophageal reflux disease)     ALLERGIES:   has no known allergies.  MEDICATIONS:  Current Outpatient  Prescriptions  Medication Sig Dispense Refill  . Ascorbic Acid (VITAMIN C) 1000 MG tablet Take 1,000 mg by mouth daily.        Marland Kitchen aspirin 81 MG tablet Take 81 mg by mouth daily.        Marland Kitchen atorvastatin (LIPITOR) 80 MG tablet       . buPROPion (WELLBUTRIN SR) 200 MG 12 hr tablet       . cetirizine (ZYRTEC) 10 MG tablet Take 10 mg by mouth daily.        . cholecalciferol (VITAMIN D) 1000 UNITS tablet Take 10,000 Units by mouth daily.       Marland Kitchen DEXILANT 60 MG capsule       . exemestane (AROMASIN) 25 MG tablet Take 25 mg by mouth daily after breakfast.      . glucosamine-chondroitin 500-400 MG tablet Take 1 tablet by mouth 3 (three) times daily.        Marland Kitchen LOVAZA 1 G capsule       . Prenatal Vit-Fe Psac Cmplx-FA (PRENATAL MULTIVITAMIN) 60-1 MG tablet Take 1 tablet by mouth daily with breakfast.        . pyridOXINE (VITAMIN B-6) 100 MG tablet Take 100 mg by mouth daily.        . vitamin B-12 (CYANOCOBALAMIN) 1000 MCG tablet Take 1,000 mcg by mouth daily.        . vitamin E 800 UNIT capsule Take 800 Units by mouth daily.        Marland Kitchen LASTACAFT 0.25 % SOLN BID times 48H.      Marland Kitchen  LOTEMAX 0.5 % OINT BID times 48H.      Marland Kitchen omeprazole (PRILOSEC OTC) 20 MG tablet Take 20 mg by mouth daily.          SURGICAL HISTORY:  Past Surgical History  Procedure Date  . Hernia repair   . Knee arthroscopy 2006  . Gallbladder surgery 2003  . Thyroid surgery 1979  . Mastectomy partial / lumpectomy 06/11/2011    Left    REVIEW OF SYSTEMS:  Pertinent items are noted in HPI.   PHYSICAL EXAMINATION: General appearance: alert, cooperative and appears stated age Lymph nodes: Cervical, supraclavicular, and axillary nodes normal. Resp: clear to auscultation bilaterally Back: symmetric, no curvature. ROM normal. No CVA tenderness. Cardio: regular rate and rhythm, S1, S2 normal, no murmur, click, rub or gallop GI: soft, non-tender; bowel sounds normal; no masses,  no organomegaly Extremities: extremities normal, atraumatic, no  cyanosis or edema Neurologic: Alert and oriented X 3, normal strength and tone. Normal symmetric reflexes. Normal coordination and gait Bilateral breast examination is performed left breast reveals a very well-healed surgical scar it is barely visible there are no masses no nipple discharge no skin changes. Right breast no masses nipple discharge or skin changes. ECOG PERFORMANCE STATUS: 0 - Asymptomatic  Blood pressure 121/80, pulse 78, temperature 97.7 F (36.5 C), temperature source Oral, height 5\' 8"  (1.727 m), weight 195 lb (88.451 kg).  LABORATORY DATA: Lab Results  Component Value Date   WBC 13.2* 12/01/2011   HGB 14.5 12/01/2011   HCT 43.4 12/01/2011   MCV 93.5 12/01/2011   PLT 334 12/01/2011      Chemistry      Component Value Date/Time   NA 143 12/01/2011 1103   K 4.0 12/01/2011 1103   CL 107 12/01/2011 1103   CO2 23 12/01/2011 1103   BUN 13 12/01/2011 1103   CREATININE 0.81 12/01/2011 1103      Component Value Date/Time   CALCIUM 9.7 12/01/2011 1103   ALKPHOS 84 12/01/2011 1103   AST 15 12/01/2011 1103   ALT 21 12/01/2011 1103   BILITOT 0.9 12/01/2011 1103       RADIOGRAPHIC STUDIES:  No results found.  ASSESSMENT: 61 year old female with  #1 ductal carcinoma in situ of the left breast diagnosed September 2012 status post lumpectomy that revealed a high-grade DCIS tumor was ER positive PR positive. She went on to receive radiation therapy and is now on adjuvant Aromasin 25 mg daily tolerating it well.  #2 CLL her white count is about 13 which is slightly up from her previous visit but overall it is stable. She has no evidence of progression of her CLL and she is doing quite well.   PLAN:   #1 continue the Aromasin 25 mg daily a total of 5 years of therapy is planned.  #2 patient will be observed for CLL. On her next visit we will obtain CBC quantitative immunoglobulins haptoglobin and Coombs testing to rule out any type of hemolytic anemia secondary to CLL.  #3  patient knows to call with any problems questions or concerns.   All questions were answered. The patient knows to call the clinic with any problems, questions or concerns. We can certainly see the patient much sooner if necessary.  I spent 25 minutes counseling the patient face to face. The total time spent in the appointment was 30 minutes.    Drue Second, MD Medical/Oncology [facility] [phone] (beeper) [phone] (Office)  12/01/2011, 5:36 PM

## 2011-12-02 LAB — COMPREHENSIVE METABOLIC PANEL
ALT: 21 U/L (ref 0–35)
AST: 15 U/L (ref 0–37)
CO2: 23 mEq/L (ref 19–32)
Creatinine, Ser: 0.81 mg/dL (ref 0.50–1.10)
Total Bilirubin: 0.9 mg/dL (ref 0.3–1.2)

## 2011-12-02 LAB — VITAMIN D 25 HYDROXY (VIT D DEFICIENCY, FRACTURES): Vit D, 25-Hydroxy: 84 ng/mL (ref 30–89)

## 2011-12-03 ENCOUNTER — Encounter: Payer: Self-pay | Admitting: *Deleted

## 2011-12-03 NOTE — Progress Notes (Unsigned)
Attempted to notify pt that per MD, labs "look great"

## 2012-02-24 ENCOUNTER — Other Ambulatory Visit: Payer: Self-pay | Admitting: Obstetrics and Gynecology

## 2012-02-24 DIAGNOSIS — Z853 Personal history of malignant neoplasm of breast: Secondary | ICD-10-CM

## 2012-03-29 ENCOUNTER — Ambulatory Visit: Payer: Federal, State, Local not specified - PPO | Admitting: Radiation Oncology

## 2012-03-30 DIAGNOSIS — C50919 Malignant neoplasm of unspecified site of unspecified female breast: Secondary | ICD-10-CM | POA: Insufficient documentation

## 2012-03-31 ENCOUNTER — Ambulatory Visit
Admission: RE | Admit: 2012-03-31 | Discharge: 2012-03-31 | Disposition: A | Discharge status: Home / Self Care | Payer: Federal, State, Local not specified - PPO | Source: Ambulatory Visit | Attending: Radiation Oncology | Admitting: Radiation Oncology

## 2012-03-31 ENCOUNTER — Encounter: Payer: Self-pay | Admitting: Radiation Oncology

## 2012-03-31 VITALS — BP 99/65 | HR 73 | Temp 97.5°F | Resp 18 | Wt 184.6 lb

## 2012-03-31 DIAGNOSIS — D0510 Intraductal carcinoma in situ of unspecified breast: Secondary | ICD-10-CM

## 2012-03-31 NOTE — Progress Notes (Signed)
Radiation Oncology         [phone]) [phone] ________________________________  Name: Colleen Moreno MRN: [id number]  Date: 03/31/2012  DOB: May 19, 1951  Follow-Up Visit Note  CC: [name], MD  Jamey Ripa Reola Mosher, MD  Diagnosis:   Intraductal carcinoma of the left breast  Interval Since Last Radiation:  7 months  Narrative:  The patient returns today for routine follow-up.  She seems to be doing well except for chronic problems with her appetite. Patient does attribute this to her husband has been been very sick and recovering from extensive neck surgery. She denies any pain within left breast nipple discharge or bleeding. She denies a problems with swelling in her left arm or hand. Patient continues to take Aromasin under the direction of Dr. Welton Flakes.  She is due for mammograms in September.                             ALLERGIES:   has no known allergies.  Meds: Current Outpatient Prescriptions  Medication Sig Dispense Refill  . Ascorbic Acid (VITAMIN C) 1000 MG tablet Take 1,000 mg by mouth daily.        Marland Kitchen aspirin 81 MG tablet Take 81 mg by mouth daily.        Marland Kitchen atorvastatin (LIPITOR) 80 MG tablet       . buPROPion (WELLBUTRIN SR) 200 MG 12 hr tablet       . cetirizine (ZYRTEC) 10 MG tablet Take 10 mg by mouth daily.        . cholecalciferol (VITAMIN D) 1000 UNITS tablet Take 10,000 Units by mouth daily.       Marland Kitchen DEXILANT 60 MG capsule       . exemestane (AROMASIN) 25 MG tablet Take 25 mg by mouth daily after breakfast.      . glucosamine-chondroitin 500-400 MG tablet Take 1 tablet by mouth 3 (three) times daily.        Marland Kitchen LASTACAFT 0.25 % SOLN BID times 48H.      . LOTEMAX 0.5 % OINT BID times 48H.      Marland Kitchen LOVAZA 1 G capsule       . Prenatal Vit-Fe Psac Cmplx-FA (PRENATAL MULTIVITAMIN) 60-1 MG tablet Take 1 tablet by mouth daily with breakfast.        . pyridOXINE (VITAMIN B-6) 100 MG tablet Take 100 mg by mouth daily.        . vitamin B-12 (CYANOCOBALAMIN) 1000 MCG tablet Take  1,000 mcg by mouth daily.        . vitamin E 800 UNIT capsule Take 800 Units by mouth daily.        Marland Kitchen omeprazole (PRILOSEC OTC) 20 MG tablet Take 20 mg by mouth daily.          Physical Findings: The patient is in no acute distress. Patient is alert and oriented.  weight is 184 lb 9.6 oz (83.734 kg). Her oral temperature is 97.5 F (36.4 C). Her blood pressure is 99/65 and her pulse is 73. Her respiration is 18 and oxygen saturation is 97%. .  No palpable supraclavicular or axillary adenopathy. The lungs are clear to auscultation. The heart has regular rhythm and rate. Examination of the  right breast reveals no mass or nipple discharge. Examination left breast reveals some very faint hyperpigmentation changes. Patient has an excellent cosmetic result. There is no palpable mass within the breast, nipple discharge or bleeding.  Lab Findings: Lab  Results  Component Value Date   WBC 13.2* 12/01/2011   HGB 14.5 12/01/2011   HCT 43.4 12/01/2011   MCV 93.5 12/01/2011   PLT 334 12/01/2011    @LASTCHEM @  Radiographic Findings: No results found.  Impression:  The patient is recovering from the effects of radiation.  No signs of recurrence on exam today.  Plan:  When necessary followup in light of the patient's close followup with Dr. Welton Flakes and Dr. Jamey Ripa.  _____________________________________   Billie Lade, PhD, MD

## 2012-03-31 NOTE — Progress Notes (Signed)
Patient presents to the clinic today unaccompanied for a follow up appointment with Dr. Roselind Messier. Patient is alert and oriented to person, place, and time. No distress noted. Steady gait noted. Pleasant affect noted. Patient denies pain at this time. However, patient reports an occasional brief intermittent sharp shooting pain. Patient denies nipple discharge. Patient reports normal appearance of left breast. Patient denies warmth, redness or swelling of the left breast. Patient denies swelling of her left arm. Patient reports a decreased appetite. Patient reports that she has lost 23 pounds since November. Patient reports taking Aromasin 25 mg daily as directed. Patient reports intermittent hot flashes. Patient has a mammogram scheduled for September. Reported all findings to Dr. Roselind Messier.

## 2012-05-18 ENCOUNTER — Ambulatory Visit
Admission: RE | Admit: 2012-05-18 | Discharge: 2012-05-18 | Disposition: A | Discharge status: Home / Self Care | Payer: Federal, State, Local not specified - PPO | Source: Ambulatory Visit | Attending: Obstetrics and Gynecology | Admitting: Obstetrics and Gynecology

## 2012-05-18 DIAGNOSIS — Z853 Personal history of malignant neoplasm of breast: Secondary | ICD-10-CM

## 2012-06-02 ENCOUNTER — Telehealth: Payer: Self-pay | Admitting: *Deleted

## 2012-06-02 ENCOUNTER — Encounter: Payer: Self-pay | Admitting: Oncology

## 2012-06-02 ENCOUNTER — Other Ambulatory Visit (HOSPITAL_BASED_OUTPATIENT_CLINIC_OR_DEPARTMENT_OTHER): Payer: Federal, State, Local not specified - PPO | Admitting: Lab

## 2012-06-02 ENCOUNTER — Ambulatory Visit (HOSPITAL_BASED_OUTPATIENT_CLINIC_OR_DEPARTMENT_OTHER): Payer: Federal, State, Local not specified - PPO | Admitting: Oncology

## 2012-06-02 VITALS — BP 120/79 | HR 69 | Temp 97.7°F | Resp 20 | Ht 68.0 in | Wt 184.5 lb

## 2012-06-02 DIAGNOSIS — D0590 Unspecified type of carcinoma in situ of unspecified breast: Secondary | ICD-10-CM

## 2012-06-02 DIAGNOSIS — C9110 Chronic lymphocytic leukemia of B-cell type not having achieved remission: Secondary | ICD-10-CM

## 2012-06-02 DIAGNOSIS — D0510 Intraductal carcinoma in situ of unspecified breast: Secondary | ICD-10-CM

## 2012-06-02 DIAGNOSIS — Z170 Estrogen receptor positive status [ER+]: Secondary | ICD-10-CM

## 2012-06-02 LAB — CBC WITH DIFFERENTIAL/PLATELET
Basophils Absolute: 0.1 10*3/uL (ref 0.0–0.1)
EOS%: 2.7 % (ref 0.0–7.0)
Eosinophils Absolute: 0.4 10*3/uL (ref 0.0–0.5)
HCT: 42.9 % (ref 34.8–46.6)
HGB: 14.2 g/dL (ref 11.6–15.9)
MCH: 30.6 pg (ref 25.1–34.0)
NEUT#: 4.3 10*3/uL (ref 1.5–6.5)
NEUT%: 27.6 % — ABNORMAL LOW (ref 38.4–76.8)
lymph#: 10.1 10*3/uL — ABNORMAL HIGH (ref 0.9–3.3)

## 2012-06-02 LAB — COMPREHENSIVE METABOLIC PANEL (CC13)
ALT: 20 U/L (ref 0–55)
AST: 14 U/L (ref 5–34)
Alkaline Phosphatase: 88 U/L (ref 40–150)
BUN: 12 mg/dL (ref 7.0–26.0)
Chloride: 106 mEq/L (ref 98–107)
Creatinine: 0.9 mg/dL (ref 0.6–1.1)
Total Bilirubin: 1.3 mg/dL — ABNORMAL HIGH (ref 0.20–1.20)

## 2012-06-02 NOTE — Telephone Encounter (Signed)
12-01-2012 starting at 11:30am

## 2012-06-02 NOTE — Progress Notes (Signed)
OFFICE PROGRESS NOTE  CC  [name], MD [address] [place] [address] Dr. Jeanene Erb Dr. Cyndia Bent Dr. 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  PRIOR THERAPY:  #1 patient is status post left lumpectomy that showed a final pathology with high-grade ductal carcinoma in situ with associated biopsy site reaction margins were not involved tumor was estrogen receptor +76% progesterone receptor +86%.  #2 patient then went on to receive radiation therapy to the left breast by Dr. Fayrene Fearing kinder.  #3 patient was then begun on adjuvant antiestrogen therapy consisting of Aromasin 25 mg daily. A total of 5 years therapy is planned. Her treatment was started in December 2012.  CURRENT THERAPY: Aromasin 25 mg daily since December 2012  INTERVAL HISTORY: Colleen Moreno 61 y.o. female returns for followup visit today. Patient was last seen about 6 months ago. She continues to be on Aromasin she is tolerating it well. She has no evidence of recurrent disease. She has no side effects from Aromasin other than some hot flashes. She denies any fevers chills night sweats headaches shortness of breath chest pains palpitations she has no lumps or bumps. She is not noticed any easy bruising or bleeding. She has not had any recurrent infections. Remainder of the 10 point review of systems is negative. MEDICAL HISTORY: Past Medical History  Diagnosis Date  . Clotting disorder 2006    blood clot  . 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 Allergy   . Hyperlipidemia   . CLL (chronic lymphoblastic leukemia)   . Breast cancer     left  . GERD (gastroesophageal reflux disease)     ALLERGIES:   has no known allergies.  MEDICATIONS:  Current Outpatient Prescriptions  Medication Sig Dispense Refill    . meclizine (ANTIVERT) 25 MG tablet Take 25 mg by mouth 3 (three) times daily as needed.      . Ascorbic Acid (VITAMIN C) 1000 MG tablet Take 1,000 mg by mouth daily.        Marland Kitchen aspirin 81 MG tablet Take 81 mg by mouth daily.        Marland Kitchen atorvastatin (LIPITOR) 80 MG tablet       . buPROPion (WELLBUTRIN SR) 200 MG 12 hr tablet       . cetirizine (ZYRTEC) 10 MG tablet Take 10 mg by mouth daily.        . cholecalciferol (VITAMIN D) 1000 UNITS tablet Take 10,000 Units by mouth daily.       Marland Kitchen DEXILANT 60 MG capsule       . exemestane (AROMASIN) 25 MG tablet Take 25 mg by mouth daily after breakfast.      . glucosamine-chondroitin 500-400 MG tablet Take 1 tablet by mouth 3 (three) times daily.        Marland Kitchen LASTACAFT 0.25 % SOLN BID times 48H.      . LOTEMAX 0.5 % OINT BID times 48H.      Marland Kitchen LOVAZA 1 G capsule       . omeprazole (PRILOSEC OTC) 20 MG tablet Take 20 mg by mouth daily.        . Prenatal Vit-Fe Psac Cmplx-FA (PRENATAL MULTIVITAMIN) 60-1 MG tablet Take 1 tablet by mouth daily with breakfast.        . pyridOXINE (VITAMIN B-6) 100 MG tablet  Take 100 mg by mouth daily.        . vitamin B-12 (CYANOCOBALAMIN) 1000 MCG tablet Take 1,000 mcg by mouth daily.        . vitamin E 800 UNIT capsule Take 800 Units by mouth daily.          SURGICAL HISTORY:  Past Surgical History  Procedure Date  . Hernia repair   . Knee arthroscopy 2006  . Gallbladder surgery 2003  . Thyroid surgery 1979  . Mastectomy partial / lumpectomy 06/11/2011    Left    REVIEW OF SYSTEMS:  Pertinent items are noted in HPI.   PHYSICAL EXAMINATION: General appearance: alert, cooperative and appears stated age Lymph nodes: Cervical, supraclavicular, and axillary nodes normal. Resp: clear to auscultation bilaterally Back: symmetric, no curvature. ROM normal. No CVA tenderness. Cardio: regular rate and rhythm, S1, S2 normal, no murmur, click, rub or gallop GI: soft, non-tender; bowel sounds normal; no masses,  no  organomegaly Extremities: extremities normal, atraumatic, no cyanosis or edema Neurologic: Alert and oriented X 3, normal strength and tone. Normal symmetric reflexes. Normal coordination and gait Bilateral breast examination is performed left breast reveals a very well-healed surgical scar it is barely visible there are no masses no nipple discharge no skin changes. Right breast no masses nipple discharge or skin changes. ECOG PERFORMANCE STATUS: 0 - Asymptomatic  Blood pressure 120/79, pulse 69, temperature 97.7 F (36.5 C), temperature source Oral, resp. rate 20, height 5\' 8"  (1.727 m), weight 184 lb 8 oz (83.689 kg).  LABORATORY DATA: Lab Results  Component Value Date   WBC 15.5* 06/02/2012   HGB 14.2 06/02/2012   HCT 42.9 06/02/2012   MCV 92.8 06/02/2012   PLT 280 06/02/2012      Chemistry      Component Value Date/Time   NA 143 12/01/2011 1103   K 4.0 12/01/2011 1103   CL 107 12/01/2011 1103   CO2 23 12/01/2011 1103   BUN 13 12/01/2011 1103   CREATININE 0.81 12/01/2011 1103      Component Value Date/Time   CALCIUM 9.7 12/01/2011 1103   ALKPHOS 84 12/01/2011 1103   AST 15 12/01/2011 1103   ALT 21 12/01/2011 1103   BILITOT 0.9 12/01/2011 1103       RADIOGRAPHIC STUDIES:  No results found.  ASSESSMENT: 61 year old female with  #1 ductal carcinoma in situ of the left breast diagnosed September 2012 status post lumpectomy that revealed a high-grade DCIS tumor was ER positive PR positive. She went on to receive radiation therapy and is now on adjuvant Aromasin 25 mg daily tolerating it well.  #2 CLL her white count is about 13 which is slightly up from her previous visit but overall it is stable. She has no evidence of progression of her CLL and she is doing quite well.   PLAN:   #1 continue the Aromasin 25 mg daily a total of 5 years of therapy is planned.  #2 patient will be observed for CLL. On her next visit we will obtain CBC quantitative immunoglobulins haptoglobin and  Coombs testing to rule out any type of hemolytic anemia secondary to CLL.  #3 patient knows to call with any problems questions or concerns.   All questions were answered. The patient knows to call the clinic with any problems, questions or concerns. We can certainly see the patient much sooner if necessary.  I spent 25 minutes counseling the patient face to face. The total time spent in the appointment was  30 minutes.    Drue Second, MD Medical/Oncology [facility] [phone] (beeper) [phone] (Office)  06/02/2012, 12:04 PM

## 2012-06-02 NOTE — Patient Instructions (Addendum)
Doing no evidence of recurrent breast cancer or progression of CLL  I will see you back in 6 months

## 2012-06-03 LAB — DIRECT ANTIGLOBULIN TEST (NOT AT ARMC)
DAT (Complement): NEGATIVE
DAT IgG: NEGATIVE

## 2012-06-03 LAB — IGG, IGA, IGM
IgA: 84 mg/dL (ref 69–380)
IgM, Serum: 18 mg/dL — ABNORMAL LOW (ref 52–322)

## 2012-06-03 LAB — HAPTOGLOBIN: Haptoglobin: 226 mg/dL — ABNORMAL HIGH (ref 45–215)

## 2012-06-04 ENCOUNTER — Other Ambulatory Visit: Payer: Self-pay | Admitting: Obstetrics and Gynecology

## 2012-08-23 ENCOUNTER — Other Ambulatory Visit: Payer: Self-pay | Admitting: Oncology

## 2012-12-01 ENCOUNTER — Telehealth: Payer: Self-pay | Admitting: *Deleted

## 2012-12-01 ENCOUNTER — Ambulatory Visit (HOSPITAL_BASED_OUTPATIENT_CLINIC_OR_DEPARTMENT_OTHER): Payer: Federal, State, Local not specified - PPO | Admitting: Oncology

## 2012-12-01 ENCOUNTER — Encounter: Payer: Self-pay | Admitting: Oncology

## 2012-12-01 ENCOUNTER — Other Ambulatory Visit (HOSPITAL_BASED_OUTPATIENT_CLINIC_OR_DEPARTMENT_OTHER): Payer: Federal, State, Local not specified - PPO

## 2012-12-01 VITALS — BP 103/74 | HR 94 | Temp 98.1°F | Resp 20 | Ht 68.0 in | Wt 170.0 lb

## 2012-12-01 DIAGNOSIS — R05 Cough: Secondary | ICD-10-CM

## 2012-12-01 DIAGNOSIS — C9110 Chronic lymphocytic leukemia of B-cell type not having achieved remission: Secondary | ICD-10-CM

## 2012-12-01 DIAGNOSIS — R059 Cough, unspecified: Secondary | ICD-10-CM

## 2012-12-01 DIAGNOSIS — D0512 Intraductal carcinoma in situ of left breast: Secondary | ICD-10-CM

## 2012-12-01 DIAGNOSIS — D0590 Unspecified type of carcinoma in situ of unspecified breast: Secondary | ICD-10-CM

## 2012-12-01 DIAGNOSIS — D0510 Intraductal carcinoma in situ of unspecified breast: Secondary | ICD-10-CM

## 2012-12-01 LAB — COMPREHENSIVE METABOLIC PANEL (CC13)
AST: 14 U/L (ref 5–34)
Albumin: 3.9 g/dL (ref 3.5–5.0)
Alkaline Phosphatase: 102 U/L (ref 40–150)
BUN: 14.8 mg/dL (ref 7.0–26.0)
Calcium: 9.7 mg/dL (ref 8.4–10.4)
Chloride: 106 mEq/L (ref 98–107)
Creatinine: 0.9 mg/dL (ref 0.6–1.1)
Glucose: 109 mg/dl — ABNORMAL HIGH (ref 70–99)
Potassium: 3.9 mEq/L (ref 3.5–5.1)

## 2012-12-01 LAB — CBC WITH DIFFERENTIAL/PLATELET
Basophils Absolute: 0 10*3/uL (ref 0.0–0.1)
EOS%: 2.8 % (ref 0.0–7.0)
Eosinophils Absolute: 0.5 10*3/uL (ref 0.0–0.5)
HCT: 44.6 % (ref 34.8–46.6)
HGB: 15 g/dL (ref 11.6–15.9)
MCH: 30.3 pg (ref 25.1–34.0)
MCV: 90.3 fL (ref 79.5–101.0)
MONO%: 5 % (ref 0.0–14.0)
NEUT#: 6.1 10*3/uL (ref 1.5–6.5)
NEUT%: 34.9 % — ABNORMAL LOW (ref 38.4–76.8)
Platelets: 311 10*3/uL (ref 145–400)

## 2012-12-01 MED ORDER — AZITHROMYCIN 250 MG PO TABS: ORAL_TABLET | ORAL | Status: DC

## 2012-12-01 NOTE — Telephone Encounter (Signed)
appts made and printed 

## 2012-12-01 NOTE — Patient Instructions (Addendum)
Proceed with azithromycin as directed  I will see you back in 6 months

## 2012-12-01 NOTE — Progress Notes (Signed)
OFFICE PROGRESS NOTE  CC  Colleen Rua, MD [address] [place] [address] Dr. Jeanene Erb Dr. Cyndia Bent Dr. 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  PRIOR THERAPY:  #1 patient is status post left lumpectomy that showed a final pathology with high-grade ductal carcinoma in situ with associated biopsy site reaction margins were not involved tumor was estrogen receptor +76% progesterone receptor +86%.  #2 patient then went on to receive radiation therapy to the left breast by Dr. Fayrene Fearing kinder.  #3 patient was then begun on adjuvant antiestrogen therapy consisting of Aromasin 25 mg daily. A total of 5 years therapy is planned. Her treatment was started in December 2012.  CURRENT THERAPY: Aromasin 25 mg daily since December 2012  INTERVAL HISTORY: Colleen Moreno 62 y.o. female returns for followup visit today. Patient was last seen about 6 months ago. She continues to be on Aromasin she is tolerating it well. She has no evidence of recurrent disease. She has no side effects from Aromasin other than some hot flashes. She denies any fevers chills night sweats headaches shortness of breath chest pains palpitations she has no lumps or bumps. She is not noticed any easy bruising or bleeding.she developed a pneumonia in January and still has some lingering effects. She was treated with a Z-Pak and I will go ahead and give her a second dose of Z-Pak.Girtha Rm of the 10 point review of systems is negative. MEDICAL HISTORY: Past Medical History  Diagnosis Date  . Clotting disorder 2006    blood clot  . 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 Allergy   . Hyperlipidemia   . CLL (chronic lymphoblastic leukemia)   . Breast cancer     left  . GERD (gastroesophageal reflux disease)      ALLERGIES:  has No Known Allergies.  MEDICATIONS:  Current Outpatient Prescriptions  Medication Sig Dispense Refill  . Ascorbic Acid (VITAMIN C) 1000 MG tablet Take 1,000 mg by mouth daily.        Marland Kitchen aspirin 81 MG tablet Take 81 mg by mouth daily.        Marland Kitchen atorvastatin (LIPITOR) 80 MG tablet       . buPROPion (WELLBUTRIN SR) 200 MG 12 hr tablet       . cetirizine (ZYRTEC) 10 MG tablet Take 10 mg by mouth daily.        . cholecalciferol (VITAMIN D) 1000 UNITS tablet Take 10,000 Units by mouth daily.       Marland Kitchen DEXILANT 60 MG capsule       . exemestane (AROMASIN) 25 MG tablet TAKE 1 TABLET DAILY  90 tablet  3  . glucosamine-chondroitin 500-400 MG tablet Take 1 tablet by mouth 3 (three) times daily.        Marland Kitchen LOVAZA 1 G capsule       . meclizine (ANTIVERT) 25 MG tablet Take 25 mg by mouth 3 (three) times daily as needed.      . Prenatal Vit-Fe Psac Cmplx-FA (PRENATAL MULTIVITAMIN) 60-1 MG tablet Take 1 tablet by mouth daily with breakfast.        . pyridOXINE (VITAMIN B-6) 100 MG tablet Take 100 mg by mouth daily.        . vitamin B-12 (CYANOCOBALAMIN) 1000 MCG tablet Take 1,000 mcg by mouth daily.        Marland Kitchen  vitamin E 800 UNIT capsule Take 800 Units by mouth daily.        Marland Kitchen azithromycin (ZITHROMAX Z-PAK) 250 MG tablet Take 2 today then 1 a day until completed  6 each  0   No current facility-administered medications for this visit.    SURGICAL HISTORY:  Past Surgical History  Procedure Laterality Date  . Hernia repair    . Knee arthroscopy  2006  . Gallbladder surgery  2003  . Thyroid surgery  1979  . Mastectomy partial / lumpectomy  06/11/2011    Left    REVIEW OF SYSTEMS:  Pertinent items are noted in HPI.   PHYSICAL EXAMINATION: General appearance: alert, cooperative and appears stated age Lymph nodes: Cervical, supraclavicular, and axillary nodes normal. Resp: clear to auscultation bilaterally Back: symmetric, no curvature. ROM normal. No CVA tenderness. Cardio: regular rate  and rhythm, S1, S2 normal, no murmur, click, rub or gallop GI: soft, non-tender; bowel sounds normal; no masses,  no organomegaly Extremities: extremities normal, atraumatic, no cyanosis or edema Neurologic: Alert and oriented X 3, normal strength and tone. Normal symmetric reflexes. Normal coordination and gait Bilateral breast examination is performed left breast reveals a very well-healed surgical scar it is barely visible there are no masses no nipple discharge no skin changes. Right breast no masses nipple discharge or skin changes. ECOG PERFORMANCE STATUS: 0 - Asymptomatic  Blood pressure 103/74, pulse 94, temperature 98.1 F (36.7 C), temperature source Oral, resp. rate 20, height 5\' 8"  (1.727 m), weight 170 lb (77.111 kg).  LABORATORY DATA: Lab Results  Component Value Date   WBC 17.3* 12/01/2012   HGB 15.0 12/01/2012   HCT 44.6 12/01/2012   MCV 90.3 12/01/2012   PLT 311 12/01/2012      Chemistry      Component Value Date/Time   NA 141 12/01/2012 1108   NA 143 12/01/2011 1103   K 3.9 12/01/2012 1108   K 4.0 12/01/2011 1103   CL 106 12/01/2012 1108   CL 107 12/01/2011 1103   CO2 25 12/01/2012 1108   CO2 23 12/01/2011 1103   BUN 14.8 12/01/2012 1108   BUN 13 12/01/2011 1103   CREATININE 0.9 12/01/2012 1108   CREATININE 0.81 12/01/2011 1103      Component Value Date/Time   CALCIUM 9.7 12/01/2012 1108   CALCIUM 9.7 12/01/2011 1103   ALKPHOS 102 12/01/2012 1108   ALKPHOS 84 12/01/2011 1103   AST 14 12/01/2012 1108   AST 15 12/01/2011 1103   ALT 17 12/01/2012 1108   ALT 21 12/01/2011 1103   BILITOT 0.86 12/01/2012 1108   BILITOT 0.9 12/01/2011 1103       RADIOGRAPHIC STUDIES:  No results found.  ASSESSMENT: 62 year old female with  #1 ductal carcinoma in situ of the left breast diagnosed September 2012 status post lumpectomy that revealed a high-grade DCIS tumor was ER positive PR positive. She went on to receive radiation therapy and is now on adjuvant Aromasin 25 mg daily tolerating  it well.  #2 CLL her white count is about 13 which is slightly up from her previous visit but overall it is stable. She has no evidence of progression of her CLL and she is doing quite well.  #3 recent history of pneumonia. Patient still is continuing to have cough.   PLAN:   #1 continue the Aromasin 25 mg daily a total of 5 years of therapy is planned.  #2 patient will be observed for CLL. On her next visit we  will obtain CBC quantitative immunoglobulins haptoglobin and Coombs testing to rule out any type of hemolytic anemia secondary to CLL.  #3 repeat dose of Z-Pak.  #4 patient knows to call with any problems questions or concerns.   All questions were answered. The patient knows to call the clinic with any problems, questions or concerns. We can certainly see the patient much sooner if necessary.  I spent 25 minutes counseling the patient face to face. The total time spent in the appointment was 30 minutes.    Drue Second, MD Medical/Oncology [facility] [phone] (beeper) [phone] (Office)  12/01/2012, 12:39 PM

## 2013-03-14 ENCOUNTER — Other Ambulatory Visit: Payer: Self-pay | Admitting: Gastroenterology

## 2013-03-14 DIAGNOSIS — R112 Nausea with vomiting, unspecified: Secondary | ICD-10-CM

## 2013-03-14 DIAGNOSIS — R634 Abnormal weight loss: Secondary | ICD-10-CM

## 2013-03-17 ENCOUNTER — Ambulatory Visit
Admission: RE | Admit: 2013-03-17 | Discharge: 2013-03-17 | Disposition: A | Discharge status: Home / Self Care | Payer: Federal, State, Local not specified - PPO | Source: Ambulatory Visit | Attending: Gastroenterology | Admitting: Gastroenterology

## 2013-03-17 DIAGNOSIS — R112 Nausea with vomiting, unspecified: Secondary | ICD-10-CM

## 2013-03-17 DIAGNOSIS — R634 Abnormal weight loss: Secondary | ICD-10-CM

## 2013-03-17 MED ORDER — IOHEXOL 300 MG/ML  SOLN
100.0000 mL | Freq: Once | INTRAMUSCULAR | Status: AC | PRN
  Administered 2013-03-17: 100 mL via INTRAVENOUS

## 2013-03-21 ENCOUNTER — Telehealth: Payer: Self-pay | Admitting: Medical Oncology

## 2013-03-21 NOTE — Telephone Encounter (Signed)
Patient LVMOM stating she was told "something suspicious" was seen on the Ct scan she had 07/03. Asking for referral for breast mammogram ( next mammogram not sched till September).  LOV with MD 12/01/12. Next sched appt with lab/MD 06/10/13.

## 2013-03-22 ENCOUNTER — Other Ambulatory Visit: Payer: Self-pay | Admitting: Family Medicine

## 2013-03-22 DIAGNOSIS — Z853 Personal history of malignant neoplasm of breast: Secondary | ICD-10-CM

## 2013-03-22 DIAGNOSIS — N6489 Other specified disorders of breast: Secondary | ICD-10-CM

## 2013-03-23 ENCOUNTER — Other Ambulatory Visit: Payer: Self-pay | Admitting: Medical Oncology

## 2013-03-23 ENCOUNTER — Telehealth: Payer: Self-pay | Admitting: Medical Oncology

## 2013-03-23 NOTE — Telephone Encounter (Signed)
Patient LVMOM today regarding request from Monday. F/u with pt today after receiving VO today from NP for [place] and Mammog. Orders present from Dr Pablo Lawrence office for patient to have [place] and Mammogram 07/15. Patient states she called Dr Pablo Lawrence office since she did not hear back from ours, pt expressed disappointment d/t no response from office. Apologies expressed to patient.

## 2013-03-29 ENCOUNTER — Ambulatory Visit
Admission: RE | Admit: 2013-03-29 | Discharge: 2013-03-29 | Disposition: A | Discharge status: Home / Self Care | Payer: Federal, State, Local not specified - PPO | Source: Ambulatory Visit | Attending: Family Medicine | Admitting: Family Medicine

## 2013-03-29 DIAGNOSIS — Z853 Personal history of malignant neoplasm of breast: Secondary | ICD-10-CM

## 2013-03-29 DIAGNOSIS — N6489 Other specified disorders of breast: Secondary | ICD-10-CM

## 2013-04-04 ENCOUNTER — Other Ambulatory Visit: Payer: Self-pay | Admitting: Medical Oncology

## 2013-04-04 DIAGNOSIS — D0512 Intraductal carcinoma in situ of left breast: Secondary | ICD-10-CM

## 2013-04-04 NOTE — Progress Notes (Signed)
Per MD, patient to come in for appt 04/05/13 @ 2:30 with labs @1 :30. Onc tx sent. Patient informed and verbalized understanding. No further questions at this time.

## 2013-04-05 ENCOUNTER — Other Ambulatory Visit (HOSPITAL_BASED_OUTPATIENT_CLINIC_OR_DEPARTMENT_OTHER): Payer: Federal, State, Local not specified - PPO | Admitting: Lab

## 2013-04-05 ENCOUNTER — Ambulatory Visit (HOSPITAL_BASED_OUTPATIENT_CLINIC_OR_DEPARTMENT_OTHER): Payer: Federal, State, Local not specified - PPO | Admitting: Oncology

## 2013-04-05 ENCOUNTER — Encounter: Payer: Self-pay | Admitting: Oncology

## 2013-04-05 VITALS — BP 118/71 | HR 74 | Temp 98.5°F | Resp 20 | Ht 68.0 in | Wt 162.3 lb

## 2013-04-05 DIAGNOSIS — D0590 Unspecified type of carcinoma in situ of unspecified breast: Secondary | ICD-10-CM

## 2013-04-05 DIAGNOSIS — C9110 Chronic lymphocytic leukemia of B-cell type not having achieved remission: Secondary | ICD-10-CM

## 2013-04-05 DIAGNOSIS — D0512 Intraductal carcinoma in situ of left breast: Secondary | ICD-10-CM

## 2013-04-05 LAB — CBC WITH DIFFERENTIAL/PLATELET
Basophils Absolute: 0.1 10*3/uL (ref 0.0–0.1)
EOS%: 2.6 % (ref 0.0–7.0)
HGB: 14 g/dL (ref 11.6–15.9)
MCH: 31 pg (ref 25.1–34.0)
MCV: 92.4 fL (ref 79.5–101.0)
MONO%: 4 % (ref 0.0–14.0)
RDW: 13.9 % (ref 11.2–14.5)

## 2013-04-05 LAB — COMPREHENSIVE METABOLIC PANEL (CC13)
AST: 13 U/L (ref 5–34)
Albumin: 3.7 g/dL (ref 3.5–5.0)
Alkaline Phosphatase: 85 U/L (ref 40–150)
BUN: 7.7 mg/dL (ref 7.0–26.0)
Creatinine: 0.8 mg/dL (ref 0.6–1.1)
Potassium: 4 mEq/L (ref 3.5–5.1)

## 2013-04-05 MED ORDER — METHYLPREDNISOLONE (PAK) 4 MG PO TABS: ORAL_TABLET | ORAL | Status: DC

## 2013-04-05 NOTE — Progress Notes (Signed)
OFFICE PROGRESS NOTE  CC  [name], MD [address] [place] [address] Dr. Jeanene Erb Dr. Cyndia Bent Dr. 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  PRIOR THERAPY:  #1 patient is status post left lumpectomy that showed a final pathology with high-grade ductal carcinoma in situ with associated biopsy site reaction margins were not involved tumor was estrogen receptor +76% progesterone receptor +86%.  #2 patient then went on to receive radiation therapy to the left breast by Dr. Fayrene Fearing kinder.  #3 patient was then begun on adjuvant antiestrogen therapy consisting of Aromasin 25 mg daily. A total of 5 years therapy is planned. Her treatment was started in December 2012. patient has had a significant problems with Aromasin including significant weight low-dose.she has had a GI workup which is unrevealing. Therefore I do think that it is the Aromasin we will discontinue  CURRENT THERAPY: observation  INTERVAL HISTORY: Colleen Moreno 62 y.o. female returns for followup visit today. Patient was last seen about 6 months ago. She continues to be on Aromasin But does think that she is having weight loss from the Aromasin. She indeed looks like she's lost a lot of weight She has no evidence of recurrent disease.  She denies any fevers chills night sweats headaches shortness of breath chest pains palpitations she has no lumps or bumps.   Remainder of the 10 point review of systems is negative.  MEDICAL HISTORY: Past Medical History  Diagnosis Date  . Clotting disorder 2006    blood clot  . 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 Allergy   . Hyperlipidemia   . CLL (chronic lymphoblastic leukemia)   . Breast cancer     left  . GERD (gastroesophageal reflux disease)     ALLERGIES:  has  No Known Allergies.  MEDICATIONS:  Current Outpatient Prescriptions  Medication Sig Dispense Refill  . aspirin 81 MG tablet Take 81 mg by mouth daily.        Marland Kitchen atorvastatin (LIPITOR) 80 MG tablet       . buPROPion (WELLBUTRIN SR) 200 MG 12 hr tablet       . cholecalciferol (VITAMIN D) 1000 UNITS tablet Take 10,000 Units by mouth daily.       Marland Kitchen DEXILANT 60 MG capsule       . exemestane (AROMASIN) 25 MG tablet TAKE 1 TABLET DAILY  90 tablet  3  . glucosamine-chondroitin 500-400 MG tablet Take 1 tablet by mouth 3 (three) times daily.        Marland Kitchen LOVAZA 1 G capsule       . Prenatal Vit-Fe Psac Cmplx-FA (PRENATAL MULTIVITAMIN) 60-1 MG tablet Take 1 tablet by mouth daily with breakfast.        . pyridOXINE (VITAMIN B-6) 100 MG tablet Take 100 mg by mouth daily.        . vitamin B-12 (CYANOCOBALAMIN) 1000 MCG tablet Take 1,000 mcg by mouth daily.        . vitamin E 800 UNIT capsule Take 800 Units by mouth daily.        . Ascorbic Acid (VITAMIN C) 1000 MG tablet Take 1,000 mg by mouth daily.        Marland Kitchen azithromycin (ZITHROMAX Z-PAK) 250 MG tablet Take 2 today then 1 a day until completed  6 each  0  .  cetirizine (ZYRTEC) 10 MG tablet Take 10 mg by mouth daily.        . meclizine (ANTIVERT) 25 MG tablet Take 25 mg by mouth 3 (three) times daily as needed.      . methylPREDNIsolone (MEDROL DOSPACK) 4 MG tablet follow package directions  21 tablet  0   No current facility-administered medications for this visit.    SURGICAL HISTORY:  Past Surgical History  Procedure Laterality Date  . Hernia repair    . Knee arthroscopy  2006  . Gallbladder surgery  2003  . Thyroid surgery  1979  . Mastectomy partial / lumpectomy  06/11/2011    Left    REVIEW OF SYSTEMS:  Pertinent items are noted in HPI.   PHYSICAL EXAMINATION: General appearance: alert, cooperative and appears stated age Lymph nodes: Cervical, supraclavicular, and axillary nodes normal. Resp: clear to auscultation bilaterally Back:  symmetric, no curvature. ROM normal. No CVA tenderness. Cardio: regular rate and rhythm, S1, S2 normal, no murmur, click, rub or gallop GI: soft, non-tender; bowel sounds normal; no masses,  no organomegaly Extremities: extremities normal, atraumatic, no cyanosis or edema Neurologic: Alert and oriented X 3, normal strength and tone. Normal symmetric reflexes. Normal coordination and gait Bilateral breast examination is performed left breast reveals a very well-healed surgical scar it is barely visible there are no masses no nipple discharge no skin changes. Right breast no masses nipple discharge or skin changes. ECOG PERFORMANCE STATUS: 0 - Asymptomatic  Blood pressure 118/71, pulse 74, temperature 98.5 F (36.9 C), temperature source Oral, resp. rate 20, height 5\' 8"  (1.727 m), weight 162 lb 4.8 oz (73.619 kg).  LABORATORY DATA: Lab Results  Component Value Date   WBC 16.0* 04/05/2013   HGB 14.0 04/05/2013   HCT 41.9 04/05/2013   MCV 92.4 04/05/2013   PLT 330 04/05/2013      Chemistry      Component Value Date/Time   NA 143 04/05/2013 1314   NA 143 12/01/2011 1103   K 4.0 04/05/2013 1314   K 4.0 12/01/2011 1103   CL 106 12/01/2012 1108   CL 107 12/01/2011 1103   CO2 28 04/05/2013 1314   CO2 23 12/01/2011 1103   BUN 7.7 04/05/2013 1314   BUN 13 12/01/2011 1103   CREATININE 0.8 04/05/2013 1314   CREATININE 0.81 12/01/2011 1103      Component Value Date/Time   CALCIUM 9.2 04/05/2013 1314   CALCIUM 9.7 12/01/2011 1103   ALKPHOS 85 04/05/2013 1314   ALKPHOS 84 12/01/2011 1103   AST 13 04/05/2013 1314   AST 15 12/01/2011 1103   ALT 16 04/05/2013 1314   ALT 21 12/01/2011 1103   BILITOT 1.04 04/05/2013 1314   BILITOT 0.9 12/01/2011 1103       RADIOGRAPHIC STUDIES:  No results found.  ASSESSMENT: 62 year old female with  #1 ductal carcinoma in situ of the left breast diagnosed September 2012 status post lumpectomy that revealed a high-grade DCIS tumor was ER positive PR positive. She went on  to receive radiation therapy and is now on adjuvant Aromasin 25 mg daily tolerating it well.  #2 CLL her white count is about 13 which is slightly up from her previous visit but overall it is stable. She has no evidence of progression of her CLL and she is doing quite well.  #3 recent history of pneumonia. Patient still is continuing to have cough.   PLAN:  #1 patient will discontinue Aromasin 25 mg daily.  #2 CLL we  will continue to observe her.  #3 I will see her back in 6 months time for followup or sooner if need arises.  #4 patient knows to call with any problems questions or concerns.   All questions were answered. The patient knows to call the clinic with any problems, questions or concerns. We can certainly see the patient much sooner if necessary.  I spent 25 minutes counseling the patient face to face. The total time spent in the appointment was 30 minutes.    Drue Second, MD Medical/Oncology [facility] [phone] (beeper) [phone] (Office)  04/05/2013, 9:26 PM

## 2013-04-06 ENCOUNTER — Other Ambulatory Visit: Payer: Self-pay | Admitting: Medical Oncology

## 2013-04-06 MED ORDER — METHYLPREDNISOLONE (PAK) 4 MG PO TABS: ORAL_TABLET | ORAL | Status: DC

## 2013-04-06 NOTE — Telephone Encounter (Signed)
Patient called to inform office that prescription for medrol dosepak was sent to [facility] of [facility] [place]. Prescription cancelled with [facility] and sent to [facility] per patients request. Called pt to let her know prescription at [facility], no answer, LVM. Patient to call office with any questions or concerns.

## 2013-04-07 ENCOUNTER — Telehealth: Payer: Self-pay | Admitting: *Deleted

## 2013-04-07 NOTE — Telephone Encounter (Signed)
Lm gv appt d/t for 10/10/13 with labs @1 :45pm and ov @ 2:15pm. Pt is aware that i will mail a letter/avs...td

## 2013-04-11 ENCOUNTER — Telehealth: Payer: Self-pay | Admitting: *Deleted

## 2013-04-11 NOTE — Telephone Encounter (Signed)
Pt called to cancel her appts for 06/10/13....td

## 2013-04-29 ENCOUNTER — Telehealth: Payer: Self-pay | Admitting: Dietician

## 2013-04-29 NOTE — Telephone Encounter (Signed)
Brief Outpatient Oncology Nutrition Note  Patient has been identified to be at risk on malnutrition screen.  Wt Readings from Last 10 Encounters:  04/05/13 162 lb 4.8 oz (73.619 kg)  12/01/12 170 lb (77.111 kg)  06/02/12 184 lb 8 oz (83.689 kg)  03/31/12 184 lb 9.6 oz (83.734 kg)  12/01/11 195 lb (88.451 kg)  09/03/11 200 lb 11.2 oz (91.037 kg)  09/02/11 201 lb 3.2 oz (91.264 kg)  08/26/11 200 lb 12.8 oz (91.082 kg)  08/19/11 203 lb 1.6 oz (92.126 kg)  08/12/11 200 lb (90.719 kg)   Called patient due to continued weight loss.  Patient with high grade ductal carcinoma in situ with cornedoecrosis and calcification of the left breast.  Complains of poor intake and continued weight loss.  Aromasin d/c'ed last visit secondary to concern that this was causing weight loss but patient reports no improvement in intake and continued weight loss.  States that she will go 2-3 days without eating.  Drinks Ensure and other protein supplements but if tiring of them. Complains of very poor appetite.   Discussed tips to increase calories and protein.  Recommended at least 3 Ensure or other supplement daily when intake is poor.  Discussed recommendation to see [facility].  Patient states that she wants to see how she does on her own first.    Will mail patient "Tips to increase calories and protein", "tips for poor appetite", coupons, and a recipe book for nutritional shakes/smoothies.  [facility] contact info provided.  Oran Rein, RD, LDN

## 2013-06-08 ENCOUNTER — Other Ambulatory Visit: Payer: Self-pay | Admitting: Obstetrics and Gynecology

## 2013-06-10 ENCOUNTER — Ambulatory Visit: Payer: Federal, State, Local not specified - PPO | Admitting: Oncology

## 2013-06-10 ENCOUNTER — Other Ambulatory Visit: Payer: Federal, State, Local not specified - PPO | Admitting: Lab

## 2013-07-06 ENCOUNTER — Telehealth: Payer: Self-pay | Admitting: *Deleted

## 2013-07-06 NOTE — Telephone Encounter (Signed)
Pt called requesting ok for the shingles vaccine. Pt currently on Aromasin. Last seen 04/05/13. Next F/U 10/10/13.  Will review with MD.

## 2013-07-07 NOTE — Telephone Encounter (Signed)
Patient can not receive a shingles injection due to her dx of CLL

## 2013-07-07 NOTE — Telephone Encounter (Signed)
Yes

## 2013-07-08 NOTE — Telephone Encounter (Signed)
Per MD, notified pt she cannot receive shingles vaccine due to her dx of CLL. Pt verbalized understanding. No further concerns.

## 2013-07-21 ENCOUNTER — Other Ambulatory Visit: Payer: Self-pay

## 2013-07-26 ENCOUNTER — Ambulatory Visit (INDEPENDENT_AMBULATORY_CARE_PROVIDER_SITE_OTHER): Payer: Federal, State, Local not specified - PPO | Admitting: Cardiovascular Disease

## 2013-07-26 ENCOUNTER — Encounter: Payer: Self-pay | Admitting: Cardiovascular Disease

## 2013-07-26 VITALS — BP 124/80 | HR 76 | Ht 68.5 in | Wt 160.9 lb

## 2013-07-26 DIAGNOSIS — Z86711 Personal history of pulmonary embolism: Secondary | ICD-10-CM

## 2013-07-26 DIAGNOSIS — G4700 Insomnia, unspecified: Secondary | ICD-10-CM

## 2013-07-26 DIAGNOSIS — Z720 Tobacco use: Secondary | ICD-10-CM

## 2013-07-26 DIAGNOSIS — F17200 Nicotine dependence, unspecified, uncomplicated: Secondary | ICD-10-CM

## 2013-07-26 DIAGNOSIS — I10 Essential (primary) hypertension: Secondary | ICD-10-CM

## 2013-07-26 MED ORDER — ESZOPICLONE 2 MG PO TABS: ORAL_TABLET | ORAL | Status: DC

## 2013-07-26 NOTE — Patient Instructions (Signed)
Your physician recommends that you schedule a follow-up appointment in: 1 YEAR.  Your physician has recommended you make the following change in your medication: start the prescription given for [facility]

## 2013-08-14 ENCOUNTER — Encounter: Payer: Self-pay | Admitting: Cardiovascular Disease

## 2013-08-14 DIAGNOSIS — G4700 Insomnia, unspecified: Secondary | ICD-10-CM | POA: Insufficient documentation

## 2013-08-14 DIAGNOSIS — Z720 Tobacco use: Secondary | ICD-10-CM | POA: Insufficient documentation

## 2013-08-14 DIAGNOSIS — Z86711 Personal history of pulmonary embolism: Secondary | ICD-10-CM | POA: Insufficient documentation

## 2013-08-14 NOTE — Progress Notes (Signed)
Patient ID: Colleen Moreno, female   DOB: 1951/07/05, 62 y.o.   MRN: [id number]     PATIENT PROFILE: Colleen Moreno presents to the office today to establish cardiology care with me. She is a former patient of Dr. Alanda Amass who has retired.   HPI: Colleen Moreno is a 62 year old female who's had a history of atypical chest pain in the past. Apparently, she under went cardiac catheterization while in [place] in 2004 and had normal coronaries. Following a laparoscopic cholecystectomy in 2004 she developed pulmonary emboli. She did require Coumadin and took this for approximally 6 months. 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 Jamey Ripa in September 2012. She is followed by Dr. Park Breed and is on antiestrogen therapy. An echo Doppler study in the September 2012 showed mild concentric LVH with an ejection fraction of 50-55% with grade 1 diastolic dysfunction. She did have trace mitral and tricuspid regurgitation. A nuclear perfusion study at that time showed normal perfusion and function. Ejection fraction was 58%. The patient also has had issues with occasional GERD for which she has used excellent. She also has a diagnosis of CLL and has not required any therapy and this is also followed by Dr. Park Breed. She admits to a 50 pound weight loss with the right being reduced from 212 pounds to approximately 160 pounds. She does admit to some difficulty with fatigability and insomnia. She typically is able to go to bed but he wakes up in approximately 4 hours and then it is difficult for her to go back to sleep but ultimately she does fall back to sleep and wakes up approximately 9. She's retired Emergency planning/management officer. Has a long-standing history of tobacco use.  Past Medical History  Diagnosis Date  . Clotting disorder 2006    blood clot  . 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 Allergy   . Hyperlipidemia   . CLL (chronic  lymphoblastic leukemia)   . Breast cancer     left  . GERD (gastroesophageal reflux disease)     Past Surgical History  Procedure Laterality Date  . Hernia repair    . Knee arthroscopy  2006  . Gallbladder surgery  2003  . Thyroid surgery  1979  . Mastectomy partial / lumpectomy  06/11/2011    Left    No Known Allergies  Current Outpatient Prescriptions  Medication Sig Dispense Refill  . Ascorbic Acid (VITAMIN C) 1000 MG tablet Take 1,000 mg by mouth daily.        Marland Kitchen aspirin 81 MG tablet Take 81 mg by mouth daily.        Marland Kitchen buPROPion (WELLBUTRIN SR) 200 MG 12 hr tablet Patient takes on and off.      . cholecalciferol (VITAMIN D) 1000 UNITS tablet Take 10,000 Units by mouth daily.       Marland Kitchen DEXILANT 60 MG capsule Take 60 mg by mouth daily.       Marland Kitchen exemestane (AROMASIN) 25 MG tablet TAKE 1 TABLET DAILY  90 tablet  3  . glucosamine-chondroitin 500-400 MG tablet Take 1 tablet by mouth 3 (three) times daily.        Marland Kitchen LOVAZA 1 G capsule       . Prenatal Vit-Fe Psac Cmplx-FA (PRENATAL MULTIVITAMIN) 60-1 MG tablet Take 1 tablet by mouth daily with breakfast.        . pyridOXINE (VITAMIN B-6) 100 MG tablet Take 100 mg  by mouth daily.        . vitamin B-12 (CYANOCOBALAMIN) 1000 MCG tablet Take 1,000 mcg by mouth daily.        . vitamin E 800 UNIT capsule Take 800 Units by mouth daily.        . eszopiclone (LUNESTA) 2 MG TABS tablet Take immediately before bedtime for sleep maintenance  30 tablet  3   No current facility-administered medications for this visit.    Socially she was born [place]. She had lived in [place], [place] where she was a Emergency planning/management officer. At that time her husband had worked for [employer] in [place] and was transferred to [place]. She has been [place] since 2004.  Family History  Problem Relation Age of Onset  . Cancer Mother     some type uterus or cervixor ovary    ROS is negative for fever chills or night sweats. She denies skin rash.  She denies change in vision. She denies hearing difficulty. At times there is a cough. She denies wheezing. She denies presyncope or syncope. She denies exertional precipitation of chest tightness. She does have GERD intermittently. She denies blood in her stool or urine. She denies myalgias. She denies arthralgias. She does note fatigue. She has purposefully lost weight approximately 50 pounds. She is unaware of thyroid problems. There is no diabetes. She does have difficulty with sleep maintenance. She denies restless legs. There is remote history of PE. Other comprehensive 14 point system review is negative.  PE BP 124/80  Pulse 76  Ht 5' 8.5" (1.74 m)  Wt 160 lb 14.4 oz (72.984 kg)  BMI 24.11 kg/m2 General: Alert, oriented, no distress.  Skin: normal turgor, no rashes HEENT: Normocephalic, atraumatic. Pupils round and reactive; sclera anicteric; Fundi no hemorrhages or exudates. Nose without nasal septal hypertrophy Mouth/Parynx benign; Mallinpatti scale 2/3 Neck: No JVD, no carotid bruits Lungs: clear to ausculatation and percussion; no wheezing or rales Heart: RRR, s1 s2 normal short 1/6 systolic murmur at the left sternal border. No gallop. No clicks. Abdomen: soft, nontender; no hepatosplenomehaly, BS+; abdominal aorta nontender and not dilated by palpation. Pulses 2+ Extremities: no clubbinbg cyanosis or edema, Homan's sign negative  Neurologic: grossly nonfocal Psychologic: Normal mood and affect    ECG: Normal sinus rhythm at 76 beats per minute. Poor R wave progression anteriorly. Normal intervals.  LABS:  BMET    Component Value Date/Time   NA 143 04/05/2013 1314   NA 143 12/01/2011 1103   K 4.0 04/05/2013 1314   K 4.0 12/01/2011 1103   CL 106 12/01/2012 1108   CL 107 12/01/2011 1103   CO2 28 04/05/2013 1314   CO2 23 12/01/2011 1103   GLUCOSE 88 04/05/2013 1314   GLUCOSE 109* 12/01/2012 1108   GLUCOSE 95 12/01/2011 1103   BUN 7.7 04/05/2013 1314   BUN 13 12/01/2011 1103    CREATININE 0.8 04/05/2013 1314   CREATININE 0.81 12/01/2011 1103   CALCIUM 9.2 04/05/2013 1314   CALCIUM 9.7 12/01/2011 1103   GFRNONAA >60 04/20/2008 0915   GFRAA  Value: >60        The eGFR has been calculated using the MDRD equation. This calculation has not been validated in all clinical 04/20/2008 0915     Hepatic Function Panel     Component Value Date/Time   PROT 6.3* 04/05/2013 1314   PROT 6.6 12/01/2011 1103   ALBUMIN 3.7 04/05/2013 1314   ALBUMIN 4.8 12/01/2011 1103   AST 13  04/05/2013 1314   AST 15 12/01/2011 1103   ALT 16 04/05/2013 1314   ALT 21 12/01/2011 1103   ALKPHOS 85 04/05/2013 1314   ALKPHOS 84 12/01/2011 1103   BILITOT 1.04 04/05/2013 1314   BILITOT 0.9 12/01/2011 1103     CBC    Component Value Date/Time   WBC 16.0* 04/05/2013 1315   RBC 4.53 04/05/2013 1315   HGB 14.0 04/05/2013 1315   HGB 14.1 04/18/2008 1005   HCT 41.9 04/05/2013 1315   HCT 41.2 04/18/2008 1005   PLT 330 04/05/2013 1315   MCV 92.4 04/05/2013 1315   MCH 31.0 04/05/2013 1315   MCH 31.4 09/18/2010 1343   MCHC 33.5 04/05/2013 1315   RDW 13.9 04/05/2013 1315   LYMPHSABS 10.7* 04/05/2013 1315   MONOABS 0.6 04/05/2013 1315   EOSABS 0.4 04/05/2013 1315   BASOSABS 0.1 04/05/2013 1315     BNP No results found for this basename: probnp    Lipid Panel  No results found for this basename: chol, trig, hdl, cholhdl, vldl, ldlcalc     RADIOLOGY: No results found.   ASSESSMENT AND PLAN:  My impression is that Colleen Moreno is a 62 year old female who has a history of in situ ductal carcinoma of her left breast, as well as CLL. She has a long-standing tobacco history and has been trying to quit smoking completely. In the past she did not tolerate Chantix. She did have a cardiac catheterization 10 years ago and was told of having normal coronary arteries. There is a remote history of pulmonary emboli and transient anticoagulation therapy. I commended her on her 50 pound weight loss. She tells me she recently had  laboratory done at [facility] and I will try to obtain these from our review. She does have difficulty with sleep maintenance. I am suggesting a trial of Lunesta for this. Her blood pressure is well controlled. I did review her prior cardiac workup including echo and nuclear stress test. As long as she remains stable I will see her in one year for cardiology evaluation.   Lennette Bihari, MD, [facility] 08/14/2013 11:26 AM

## 2013-08-15 ENCOUNTER — Encounter: Payer: Self-pay | Admitting: Cardiovascular Disease

## 2013-09-28 ENCOUNTER — Other Ambulatory Visit: Payer: Self-pay | Admitting: Emergency Medicine

## 2013-10-06 ENCOUNTER — Ambulatory Visit (HOSPITAL_COMMUNITY)
Admission: RE | Admit: 2013-10-06 | Discharge: 2013-10-06 | Disposition: A | Discharge status: Home / Self Care | Payer: Federal, State, Local not specified - PPO | Source: Ambulatory Visit | Attending: Oncology | Admitting: Oncology

## 2013-10-06 ENCOUNTER — Other Ambulatory Visit (HOSPITAL_BASED_OUTPATIENT_CLINIC_OR_DEPARTMENT_OTHER): Payer: Federal, State, Local not specified - PPO

## 2013-10-06 DIAGNOSIS — I700 Atherosclerosis of aorta: Secondary | ICD-10-CM | POA: Insufficient documentation

## 2013-10-06 DIAGNOSIS — D1803 Hemangioma of intra-abdominal structures: Secondary | ICD-10-CM | POA: Insufficient documentation

## 2013-10-06 DIAGNOSIS — I2510 Atherosclerotic heart disease of native coronary artery without angina pectoris: Secondary | ICD-10-CM | POA: Insufficient documentation

## 2013-10-06 DIAGNOSIS — E042 Nontoxic multinodular goiter: Secondary | ICD-10-CM | POA: Insufficient documentation

## 2013-10-06 DIAGNOSIS — C9110 Chronic lymphocytic leukemia of B-cell type not having achieved remission: Secondary | ICD-10-CM

## 2013-10-06 DIAGNOSIS — J438 Other emphysema: Secondary | ICD-10-CM | POA: Insufficient documentation

## 2013-10-06 DIAGNOSIS — D0590 Unspecified type of carcinoma in situ of unspecified breast: Secondary | ICD-10-CM

## 2013-10-06 DIAGNOSIS — R911 Solitary pulmonary nodule: Secondary | ICD-10-CM | POA: Insufficient documentation

## 2013-10-06 LAB — CBC WITH DIFFERENTIAL/PLATELET
BASO%: 0.4 % (ref 0.0–2.0)
Basophils Absolute: 0.1 10*3/uL (ref 0.0–0.1)
EOS%: 2.4 % (ref 0.0–7.0)
Eosinophils Absolute: 0.3 10*3/uL (ref 0.0–0.5)
HCT: 44.8 % (ref 34.8–46.6)
HGB: 14.7 g/dL (ref 11.6–15.9)
LYMPH#: 9.2 10*3/uL — AB (ref 0.9–3.3)
LYMPH%: 66.5 % — ABNORMAL HIGH (ref 14.0–49.7)
MCH: 30.6 pg (ref 25.1–34.0)
MCHC: 32.8 g/dL (ref 31.5–36.0)
MCV: 93.1 fL (ref 79.5–101.0)
MONO#: 0.6 10*3/uL (ref 0.1–0.9)
MONO%: 4.6 % (ref 0.0–14.0)
NEUT#: 3.6 10*3/uL (ref 1.5–6.5)
NEUT%: 26.1 % — ABNORMAL LOW (ref 38.4–76.8)
Platelets: 303 10*3/uL (ref 145–400)
RBC: 4.81 10*6/uL (ref 3.70–5.45)
RDW: 13.3 % (ref 11.2–14.5)
WBC: 13.8 10*3/uL — AB (ref 3.9–10.3)

## 2013-10-06 LAB — COMPREHENSIVE METABOLIC PANEL (CC13)
ALT: 10 U/L (ref 0–55)
AST: 12 U/L (ref 5–34)
Albumin: 4.3 g/dL (ref 3.5–5.0)
Alkaline Phosphatase: 60 U/L (ref 40–150)
Anion Gap: 11 mEq/L (ref 3–11)
BUN: 11.6 mg/dL (ref 7.0–26.0)
CALCIUM: 9.6 mg/dL (ref 8.4–10.4)
CHLORIDE: 107 meq/L (ref 98–109)
CO2: 26 meq/L (ref 22–29)
CREATININE: 0.8 mg/dL (ref 0.6–1.1)
Glucose: 100 mg/dl (ref 70–140)
POTASSIUM: 3.9 meq/L (ref 3.5–5.1)
SODIUM: 144 meq/L (ref 136–145)
TOTAL PROTEIN: 6.8 g/dL (ref 6.4–8.3)
Total Bilirubin: 0.84 mg/dL (ref 0.20–1.20)

## 2013-10-06 LAB — TECHNOLOGIST REVIEW

## 2013-10-06 MED ORDER — IOHEXOL 300 MG/ML  SOLN
80.0000 mL | Freq: Once | INTRAMUSCULAR | Status: AC | PRN
  Administered 2013-10-06: 80 mL via INTRAVENOUS

## 2013-10-07 LAB — IGG, IGA, IGM
IGA: 76 mg/dL (ref 69–380)
IgG (Immunoglobin G), Serum: 396 mg/dL — ABNORMAL LOW (ref 690–1700)
IgM, Serum: 16 mg/dL — ABNORMAL LOW (ref 52–322)

## 2013-10-07 LAB — HAPTOGLOBIN: Haptoglobin: 214 mg/dL (ref 45–215)

## 2013-10-07 LAB — DIRECT ANTIGLOBULIN TEST (NOT AT ARMC)
DAT (COMPLEMENT): NEGATIVE
DAT IgG: NEGATIVE

## 2013-10-10 ENCOUNTER — Ambulatory Visit (HOSPITAL_BASED_OUTPATIENT_CLINIC_OR_DEPARTMENT_OTHER): Payer: Federal, State, Local not specified - PPO | Admitting: Oncology

## 2013-10-10 ENCOUNTER — Encounter: Payer: Self-pay | Admitting: Oncology

## 2013-10-10 ENCOUNTER — Other Ambulatory Visit: Payer: Federal, State, Local not specified - PPO

## 2013-10-10 ENCOUNTER — Telehealth: Payer: Self-pay | Admitting: Oncology

## 2013-10-10 VITALS — BP 118/76 | HR 82 | Temp 98.4°F | Resp 18 | Ht 68.0 in | Wt 159.5 lb

## 2013-10-10 DIAGNOSIS — Z720 Tobacco use: Secondary | ICD-10-CM

## 2013-10-10 DIAGNOSIS — F17200 Nicotine dependence, unspecified, uncomplicated: Secondary | ICD-10-CM

## 2013-10-10 DIAGNOSIS — D0590 Unspecified type of carcinoma in situ of unspecified breast: Secondary | ICD-10-CM

## 2013-10-10 DIAGNOSIS — C9110 Chronic lymphocytic leukemia of B-cell type not having achieved remission: Secondary | ICD-10-CM

## 2013-10-10 DIAGNOSIS — D0510 Intraductal carcinoma in situ of unspecified breast: Secondary | ICD-10-CM

## 2013-10-10 MED ORDER — ALPRAZOLAM 0.25 MG PO TABS: 0.2500 mg | ORAL_TABLET | Freq: Every evening | ORAL | Status: DC | PRN

## 2013-10-10 NOTE — Telephone Encounter (Signed)
m, °

## 2013-10-10 NOTE — Progress Notes (Signed)
OFFICE PROGRESS NOTE  CC  Orpah Melter, MD [address] [place] [address] [address]. Marty Heck Dr. Neldon Mc Dr. 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  PRIOR THERAPY:  #1 patient is status post left lumpectomy that showed a final pathology with high-grade ductal carcinoma in situ with associated biopsy site reaction margins were not involved tumor was estrogen receptor +76% progesterone receptor +86%.  #2 patient then went on to receive radiation therapy to the left breast by Dr. Jeneen Rinks kinder.  #3 patient was then begun on adjuvant antiestrogen therapy consisting of Aromasin 25 mg daily. A total of 5 years therapy is planned. Her treatment was started in December 2012. Off aromasin due to significant side effects CURRENT THERAPY: observation  INTERVAL HISTORY: Colleen Moreno 63 y.o. female returns for followup visit today. Patient was last seen about 6 months ago. She has no evidence of recurrent disease.  She denies any fevers chills night sweats headaches shortness of breath chest pains palpitations she has no lumps or bumps.   Remainder of the 10 point review of systems is negative.  MEDICAL HISTORY: Past Medical History  Diagnosis Date  . Clotting disorder 2006    blood clot  . 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 Allergy   . Hyperlipidemia   . CLL (chronic lymphoblastic leukemia)   . Breast cancer     left  . GERD (gastroesophageal reflux disease)     ALLERGIES:  has No Known Allergies.  MEDICATIONS:  Current Outpatient Prescriptions  Medication Sig Dispense Refill  . Ascorbic Acid (VITAMIN C) 1000 MG tablet Take 1,000 mg by mouth daily.        Marland Kitchen aspirin 81 MG tablet Take 81 mg by mouth daily.        . cholecalciferol (VITAMIN D) 1000 UNITS tablet Take 10,000  Units by mouth daily.       Marland Kitchen DEXILANT 60 MG capsule Take 60 mg by mouth daily.       Marland Kitchen glucosamine-chondroitin 500-400 MG tablet Take 1 tablet by mouth 3 (three) times daily.        . Prenatal Vit-Fe Psac Cmplx-FA (PRENATAL MULTIVITAMIN) 60-1 MG tablet Take 1 tablet by mouth daily with breakfast.        . pyridOXINE (VITAMIN B-6) 100 MG tablet Take 100 mg by mouth daily.        . vitamin B-12 (CYANOCOBALAMIN) 1000 MCG tablet Take 1,000 mcg by mouth daily.        . vitamin E 800 UNIT capsule Take 800 Units by mouth daily.         No current facility-administered medications for this visit.    SURGICAL HISTORY:  Past Surgical History  Procedure Laterality Date  . Hernia repair    . Knee arthroscopy  2006  . Gallbladder surgery  2003  . Thyroid surgery  1979  . Mastectomy partial / lumpectomy  06/11/2011    Left    REVIEW OF SYSTEMS:  Pertinent items are noted in HPI.   PHYSICAL EXAMINATION: General appearance: alert, cooperative and appears stated age Lymph nodes: Cervical, supraclavicular, and axillary nodes normal. Resp: clear to auscultation bilaterally Back: symmetric, no curvature. ROM normal. No CVA tenderness. Cardio: regular rate and rhythm, S1, S2 normal, no murmur, click, rub or gallop GI: soft, non-tender; bowel sounds  normal; no masses,  no organomegaly Extremities: extremities normal, atraumatic, no cyanosis or edema Neurologic: Alert and oriented X 3, normal strength and tone. Normal symmetric reflexes. Normal coordination and gait Bilateral breast examination is performed left breast reveals a very well-healed surgical scar it is barely visible there are no masses no nipple discharge no skin changes. Right breast no masses nipple discharge or skin changes. ECOG PERFORMANCE STATUS: 0 - Asymptomatic  Blood pressure 118/76, pulse 82, temperature 98.4 F (36.9 C), temperature source Oral, resp. rate 18, height 5\' 8"  (1.727 m), weight 159 lb 8 oz (72.349 kg).  LABORATORY  DATA: Lab Results  Component Value Date   WBC 13.8* 10/06/2013   HGB 14.7 10/06/2013   HCT 44.8 10/06/2013   MCV 93.1 10/06/2013   PLT 303 10/06/2013      Chemistry      Component Value Date/Time   NA 144 10/06/2013 0938   NA 143 12/01/2011 1103   K 3.9 10/06/2013 0938   K 4.0 12/01/2011 1103   CL 106 12/01/2012 1108   CL 107 12/01/2011 1103   CO2 26 10/06/2013 0938   CO2 23 12/01/2011 1103   BUN 11.6 10/06/2013 0938   BUN 13 12/01/2011 1103   CREATININE 0.8 10/06/2013 0938   CREATININE 0.81 12/01/2011 1103      Component Value Date/Time   CALCIUM 9.6 10/06/2013 0938   CALCIUM 9.7 12/01/2011 1103   ALKPHOS 60 10/06/2013 0938   ALKPHOS 84 12/01/2011 1103   AST 12 10/06/2013 0938   AST 15 12/01/2011 1103   ALT 10 10/06/2013 0938   ALT 21 12/01/2011 1103   BILITOT 0.84 10/06/2013 0938   BILITOT 0.9 12/01/2011 1103       RADIOGRAPHIC STUDIES:  No results found.  ASSESSMENT: 63 year old female with  #1 ductal carcinoma in situ of the left breast diagnosed September 2012 status post lumpectomy that revealed a high-grade DCIS tumor was ER positive PR positive. She went on to receive radiation therapy and is now on adjuvant Aromasin 25 mg daily tolerating it well.  #2 CLL her white count is about 13.8She has no evidence of progression of her CLL and she is doing quite well.  #3 patient has begun smoking again due to stressors at home with her husbands health  #4 anxiety   PLAN:  #1 Breast cancer: observation  #2 CLL we will continue to observe her.  #3 anxiety: ativan as needed  #4 I will see her back in 12 months time for followup or sooner if need arises.  #5 patient knows to call with any problems questions or concerns.   All questions were answered. The patient knows to call the clinic with any problems, questions or concerns. We can certainly see the patient much sooner if necessary.  I spent 15 minutes counseling the patient face to face. The total time spent in the  appointment was 30 minutes.    Marcy Panning, MD Medical/Oncology [facility] [phone] (beeper) [phone] (Office)  10/10/2013, 2:28 PM

## 2013-10-10 NOTE — Patient Instructions (Signed)
Smoking Cessation Quitting smoking is important to your health and has many advantages. However, it is not always easy to quit since nicotine is a very addictive drug. Often times, people try 3 times or more before being able to quit. This document explains the best ways for you to prepare to quit smoking. Quitting takes hard work and a lot of effort, but you can do it. ADVANTAGES OF QUITTING SMOKING  You will live longer, feel better, and live better.  Your body will feel the impact of quitting smoking almost immediately.  Within 20 minutes, blood pressure decreases. Your pulse returns to its normal level.  After 8 hours, carbon monoxide levels in the blood return to normal. Your oxygen level increases.  After 24 hours, the chance of having a heart attack starts to decrease. Your breath, hair, and body stop smelling like smoke.  After 48 hours, damaged nerve endings begin to recover. Your sense of taste and smell improve.  After 72 hours, the body is virtually free of nicotine. Your bronchial tubes relax and breathing becomes easier.  After 2 to 12 weeks, lungs can hold more air. Exercise becomes easier and circulation improves.  The risk of having a heart attack, stroke, cancer, or lung disease is greatly reduced.  After 1 year, the risk of coronary heart disease is cut in half.  After 5 years, the risk of stroke falls to the same as a nonsmoker.  After 10 years, the risk of lung cancer is cut in half and the risk of other cancers decreases significantly.  After 15 years, the risk of coronary heart disease drops, usually to the level of a nonsmoker.  If you are pregnant, quitting smoking will improve your chances of having a healthy baby.  The people you live with, especially any children, will be healthier.  You will have extra money to spend on things other than cigarettes. QUESTIONS TO THINK ABOUT BEFORE ATTEMPTING TO QUIT You may want to talk about your answers with your  caregiver.  Why do you want to quit?  If you tried to quit in the past, what helped and what did not?  What will be the most difficult situations for you after you quit? How will you plan to handle them?  Who can help you through the tough times? Your family? Friends? A caregiver?  What pleasures do you get from smoking? What ways can you still get pleasure if you quit? Here are some questions to ask your caregiver:  How can you help me to be successful at quitting?  What medicine do you think would be best for me and how should I take it?  What should I do if I need more help?  What is smoking withdrawal like? How can I get information on withdrawal? GET READY  Set a quit date.  Change your environment by getting rid of all cigarettes, ashtrays, matches, and lighters in your home, car, or work. Do not let people smoke in your home.  Review your past attempts to quit. Think about what worked and what did not. GET SUPPORT AND ENCOURAGEMENT You have a better chance of being successful if you have help. You can get support in many ways.  Tell your family, friends, and co-workers that you are going to quit and need their support. Ask them not to smoke around you.  Get individual, group, or telephone counseling and support. Programs are available at local hospitals and health centers. Call your local health department for   information about programs in your area.  Spiritual beliefs and practices may help some smokers quit.  Download a "quit meter" on your computer to keep track of quit statistics, such as how long you have gone without smoking, cigarettes not smoked, and money saved.  Get a self-help book about quitting smoking and staying off of tobacco. LEARN NEW SKILLS AND BEHAVIORS  Distract yourself from urges to smoke. Talk to someone, go for a walk, or occupy your time with a task.  Change your normal routine. Take a different route to work. Drink tea instead of coffee.  Eat breakfast in a different place.  Reduce your stress. Take a hot bath, exercise, or read a book.  Plan something enjoyable to do every day. Reward yourself for not smoking.  Explore interactive web-based programs that specialize in helping you quit. GET MEDICINE AND USE IT CORRECTLY Medicines can help you stop smoking and decrease the urge to smoke. Combining medicine with the above behavioral methods and support can greatly increase your chances of successfully quitting smoking.  Nicotine replacement therapy helps deliver nicotine to your body without the negative effects and risks of smoking. Nicotine replacement therapy includes nicotine gum, lozenges, inhalers, nasal sprays, and skin patches. Some may be available over-the-counter and others require a prescription.  Antidepressant medicine helps people abstain from smoking, but how this works is unknown. This medicine is available by prescription.  Nicotinic receptor partial agonist medicine simulates the effect of nicotine in your brain. This medicine is available by prescription. Ask your caregiver for advice about which medicines to use and how to use them based on your health history. Your caregiver will tell you what side effects to look out for if you choose to be on a medicine or therapy. Carefully read the information on the package. Do not use any other product containing nicotine while using a nicotine replacement product.  RELAPSE OR DIFFICULT SITUATIONS Most relapses occur within the first 3 months after quitting. Do not be discouraged if you start smoking again. Remember, most people try several times before finally quitting. You may have symptoms of withdrawal because your body is used to nicotine. You may crave cigarettes, be irritable, feel very hungry, cough often, get headaches, or have difficulty concentrating. The withdrawal symptoms are only temporary. They are strongest when you first quit, but they will go away within  10 14 days. To reduce the chances of relapse, try to:  Avoid drinking alcohol. Drinking lowers your chances of successfully quitting.  Reduce the amount of caffeine you consume. Once you quit smoking, the amount of caffeine in your body increases and can give you symptoms, such as a rapid heartbeat, sweating, and anxiety.  Avoid smokers because they can make you want to smoke.  Do not let weight gain distract you. Many smokers will gain weight when they quit, usually less than 10 pounds. Eat a healthy diet and stay active. You can always lose the weight gained after you quit.  Find ways to improve your mood other than smoking. FOR MORE INFORMATION  www.smokefree.gov  Document Released: 08/26/2001 Document Revised: 03/02/2012 Document Reviewed: 12/11/2011 [employer] Patient Information 2014 [employer].  

## 2014-02-21 ENCOUNTER — Other Ambulatory Visit: Payer: Self-pay | Admitting: Adult Health

## 2014-02-21 DIAGNOSIS — Z853 Personal history of malignant neoplasm of breast: Secondary | ICD-10-CM

## 2014-03-31 ENCOUNTER — Other Ambulatory Visit: Payer: Self-pay | Admitting: Adult Health

## 2014-03-31 ENCOUNTER — Ambulatory Visit
Admission: RE | Admit: 2014-03-31 | Discharge: 2014-03-31 | Disposition: A | Discharge status: Home / Self Care | Payer: Medicare Other | Source: Ambulatory Visit | Attending: Adult Health | Admitting: Adult Health

## 2014-03-31 DIAGNOSIS — R920 Mammographic microcalcification found on diagnostic imaging of breast: Secondary | ICD-10-CM | POA: Diagnosis not present

## 2014-03-31 DIAGNOSIS — Z853 Personal history of malignant neoplasm of breast: Secondary | ICD-10-CM | POA: Diagnosis not present

## 2014-03-31 DIAGNOSIS — R921 Mammographic calcification found on diagnostic imaging of breast: Secondary | ICD-10-CM

## 2014-04-10 ENCOUNTER — Ambulatory Visit
Admission: RE | Admit: 2014-04-10 | Discharge: 2014-04-10 | Disposition: A | Discharge status: Home / Self Care | Payer: Medicare Other | Source: Ambulatory Visit | Attending: Adult Health | Admitting: Adult Health

## 2014-04-10 DIAGNOSIS — R928 Other abnormal and inconclusive findings on diagnostic imaging of breast: Secondary | ICD-10-CM | POA: Diagnosis not present

## 2014-04-10 DIAGNOSIS — N6019 Diffuse cystic mastopathy of unspecified breast: Secondary | ICD-10-CM | POA: Diagnosis not present

## 2014-04-10 DIAGNOSIS — D249 Benign neoplasm of unspecified breast: Secondary | ICD-10-CM | POA: Diagnosis not present

## 2014-04-10 DIAGNOSIS — R921 Mammographic calcification found on diagnostic imaging of breast: Secondary | ICD-10-CM

## 2014-06-10 ENCOUNTER — Telehealth: Payer: Self-pay | Admitting: Hematology and Oncology

## 2014-06-10 NOTE — Telephone Encounter (Signed)
Spk w/pt confirming MD/schedule change, mailing out updated sch per pt req.Marland Kitchen...  KJ

## 2014-06-15 DIAGNOSIS — Z23 Encounter for immunization: Secondary | ICD-10-CM | POA: Diagnosis not present

## 2014-06-15 DIAGNOSIS — E785 Hyperlipidemia, unspecified: Secondary | ICD-10-CM | POA: Diagnosis not present

## 2014-06-23 DIAGNOSIS — D2230 Melanocytic nevi of unspecified part of face: Secondary | ICD-10-CM | POA: Diagnosis not present

## 2014-06-23 DIAGNOSIS — L814 Other melanin hyperpigmentation: Secondary | ICD-10-CM | POA: Diagnosis not present

## 2014-06-23 DIAGNOSIS — D1801 Hemangioma of skin and subcutaneous tissue: Secondary | ICD-10-CM | POA: Diagnosis not present

## 2014-06-23 DIAGNOSIS — Z86018 Personal history of other benign neoplasm: Secondary | ICD-10-CM | POA: Diagnosis not present

## 2014-06-30 ENCOUNTER — Other Ambulatory Visit: Payer: Self-pay

## 2014-08-01 ENCOUNTER — Encounter: Payer: Self-pay | Admitting: Cardiovascular Disease

## 2014-08-01 ENCOUNTER — Ambulatory Visit (INDEPENDENT_AMBULATORY_CARE_PROVIDER_SITE_OTHER): Payer: Medicare Other | Admitting: Cardiovascular Disease

## 2014-08-01 VITALS — BP 133/78 | HR 62 | Ht 70.0 in | Wt 172.8 lb

## 2014-08-01 DIAGNOSIS — R5382 Chronic fatigue, unspecified: Secondary | ICD-10-CM

## 2014-08-01 DIAGNOSIS — D0512 Intraductal carcinoma in situ of left breast: Secondary | ICD-10-CM

## 2014-08-01 DIAGNOSIS — E785 Hyperlipidemia, unspecified: Secondary | ICD-10-CM | POA: Diagnosis not present

## 2014-08-01 DIAGNOSIS — C9110 Chronic lymphocytic leukemia of B-cell type not having achieved remission: Secondary | ICD-10-CM | POA: Diagnosis not present

## 2014-08-01 DIAGNOSIS — Z86711 Personal history of pulmonary embolism: Secondary | ICD-10-CM | POA: Diagnosis not present

## 2014-08-01 DIAGNOSIS — K219 Gastro-esophageal reflux disease without esophagitis: Secondary | ICD-10-CM | POA: Diagnosis not present

## 2014-08-01 DIAGNOSIS — R5383 Other fatigue: Secondary | ICD-10-CM | POA: Insufficient documentation

## 2014-08-01 NOTE — Patient Instructions (Signed)
Your physician wants you to follow-up in: 1 year or sooner if needed with Dr. Claiborne Billings. You will receive a reminder letter in the mail two months in advance. If you don't receive a letter, please call our office to schedule the follow-up appointment.   Your physician recommends that you return for lab work done by your [facility] and have them forward a copy to Dr. Claiborne Billings once completed.

## 2014-08-01 NOTE — Progress Notes (Signed)
Patient ID: Colleen Moreno, female   DOB: August 13, 1951, 63 y.o.   MRN: [id number]      HPI: Colleen Moreno is a 63 year old female who is a former patient of Dr. Rollene Fare and established cardiology care with me one year ago.  She presents for one-year evaluation.  Colleen Moreno developed atypical chest pain while living in [place] and underwent cardiac catheterization while in [place], [place] in 2004 . She was found to have normal coronaries. Following a laparoscopic cholecystectomy in 2004 she developed pulmonary emboli. She did require Coumadin and took this for approximally 6 months. 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 Margot Chimes in September 2012. He also has a history of CLL.  She states she's not had follow-up blood work in over a year.  She had been seen by Dr. Chancy Milroy who has since left and will be reestablishing with a new oncologist in January 2016.  An echo Doppler study in the September 2012 showed mild concentric LVH with an ejection fraction of 50-55% with grade 1 diastolic dysfunction. She did have trace mitral and tricuspid regurgitation. A nuclear perfusion study at that time showed normal perfusion and function. Ejection fraction was 58%.  The patient also has had issues with occasional GERD for which she has used excellent. She admits to a 50 pound weight loss with her weight being reduced from 212 pounds to approximately 160 pounds. She does admit to some difficulty with fatigability and insomnia. She typically is able to go to bed but he wakes up in approximately 4 hours and then it is difficult for her to go back to sleep but ultimately she does fall back to sleep and wakes up approximately 9. She's retired Engineer, structural.   She had been on Lipitor 80 mg for some time and last year when repeat blood work showed a total cholesterol 147, HDL 46, LDL 79, triglycerides 112, which was excellent, she decided to self  discontinued her Lipitor.  She had follow-up blood work by [facility], [place] on October 1 now showed her total cholesterol had risen to 322 with a triglycerides of 295 and LDL cholesterol of 221.  Over the past 6 weeks.  She again has started back on her atorvastatin 80 mg.  She's not had follow-up lab work.  She is concerned that she may be developing some side effects secondary to CLL.  She will be reestablishing with an oncologist in several months.  She denies chest pain.  She admits to fatigue.  She denies shortness of breath.  She denies bleeding.  She denies infection.  Past Medical History  Diagnosis Date  . Clotting disorder 2006    blood clot  . 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 Allergy   . Hyperlipidemia   . CLL (chronic lymphoblastic leukemia)   . Breast cancer     left  . GERD (gastroesophageal reflux disease)     Past Surgical History  Procedure Laterality Date  . Hernia repair    . Knee arthroscopy  2006  . Gallbladder surgery  2003  . Thyroid surgery  1979  . Mastectomy partial / lumpectomy  06/11/2011    Left    No Known Allergies  Current Outpatient Prescriptions  Medication Sig Dispense Refill  . atorvastatin (LIPITOR) 80 MG tablet Take 80 mg by mouth daily.    . cetirizine (ZYRTEC) 10 MG tablet Take 10 mg by mouth daily.    Marland Kitchen  DEXILANT 60 MG capsule Take 60 mg by mouth daily.     Marland Kitchen omega-3 acid ethyl esters (LOVAZA) 1 G capsule Take 2 g by mouth 2 (two) times daily.     No current facility-administered medications for this visit.    Socially she was born [place]. She had lived in [place], [place] where she was a Engineer, structural. At that time her husband had worked for [employer] in [place] and was transferred to [place]. She has been [place] since 2004.  Family History  Problem Relation Age of Onset  . Cancer Mother     some type uterus or cervixor ovary   ROS General: Negative; No fevers, chills, or night  sweats;  HEENT: Negative; No changes in vision or hearing, sinus congestion, difficulty swallowing Pulmonary: Negative; No cough, wheezing, shortness of breath, hemoptysis Cardiovascular: Negative; No chest pain, presyncope, syncope, palpitations GI: issue of GERD; No nausea, vomiting, diarrhea, or abdominal pain GU: Negative; No dysuria, hematuria, or difficulty voiding Musculoskeletal: Negative; no myalgias, joint pain, or weakness Hematologic/Oncology: Negative; no easy bruising, bleeding Endocrine: Negative; no heat/cold intolerance; no diabetes Neuro: Negative; no changes in balance, headaches Skin: Negative; No rashes or skin lesions Psychiatric: Negative; No behavioral problems, depression Sleep: history of some difficulty with sleep maintenance; No snoring, daytime sleepiness, hypersomnolence, bruxism, restless legs, hypnogognic hallucinations, no cataplexy Other comprehensive 14 point system review is negative.   PE BP 133/78 mmHg  Pulse 62  Ht _0  (1.778 m)  Wt 172 lb 12.8 oz (78.382 kg)  BMI 24.79 kg/m2 General: Alert, oriented, no distress.  Skin: normal turgor, no rashes HEENT: Normocephalic, atraumatic. Pupils round and reactive; sclera anicteric; Fundi no hemorrhages or exudates. Nose without nasal septal hypertrophy Mouth/Parynx benign; Mallinpatti scale 2/3 Neck: No JVD, no carotid bruits, normal carotid upstroke Lungs: clear to ausculatation and percussion; no wheezing or rales Chest wall: Nontender to palpation Heart: RRR, s1 s2 normal short 1/6 systolic murmur at the left sternal border. No gallop. No clicks. Abdomen: soft, nontender; no hepatosplenomehaly, BS+; abdominal aorta nontender and not dilated by palpation. Back: No CVA tenderness Pulses 2+ Extremities: no clubbinbg cyanosis or edema, Homan's sign negative  Neurologic: grossly nonfocal Psychologic: Normal mood and affect  ECG (independently read by me): Normal sinus rhythm.  Incomplete right  bundle branch block.  Poor progression.  Left axis deviation.  Nondiagnostic T changes  November 2014 ECG: Normal sinus rhythm at 76 beats per minute. Poor R wave progression anteriorly. Normal intervals.  LABS:  BMET    Component Value Date/Time   NA 144 10/06/2013 0938   NA 143 12/01/2011 1103   K 3.9 10/06/2013 0938   K 4.0 12/01/2011 1103   CL 106 12/01/2012 1108   CL 107 12/01/2011 1103   CO2 26 10/06/2013 0938   CO2 23 12/01/2011 1103   GLUCOSE 100 10/06/2013 0938   GLUCOSE 109* 12/01/2012 1108   GLUCOSE 95 12/01/2011 1103   BUN 11.6 10/06/2013 0938   BUN 13 12/01/2011 1103   CREATININE 0.8 10/06/2013 0938   CREATININE 0.81 12/01/2011 1103   CALCIUM 9.6 10/06/2013 0938   CALCIUM 9.7 12/01/2011 1103   GFRNONAA >60 04/20/2008 0915   GFRAA  04/20/2008 0915    >60        The eGFR has been calculated using the MDRD equation. This calculation has not been validated in all clinical     Hepatic Function Panel     Component Value Date/Time   PROT  6.8 10/06/2013 0938   PROT 6.6 12/01/2011 1103   ALBUMIN 4.3 10/06/2013 0938   ALBUMIN 4.8 12/01/2011 1103   AST 12 10/06/2013 0938   AST 15 12/01/2011 1103   ALT 10 10/06/2013 0938   ALT 21 12/01/2011 1103   ALKPHOS 60 10/06/2013 0938   ALKPHOS 84 12/01/2011 1103   BILITOT 0.84 10/06/2013 0938   BILITOT 0.9 12/01/2011 1103     CBC    Component Value Date/Time   WBC 13.8* 10/06/2013 0937   RBC 4.81 10/06/2013 0937   HGB 14.7 10/06/2013 0937   HGB 14.1 04/18/2008 1005   HCT 44.8 10/06/2013 0937   HCT 41.2 04/18/2008 1005   PLT 303 10/06/2013 0937   MCV 93.1 10/06/2013 0937   MCH 30.6 10/06/2013 0937   MCH 31.4 09/18/2010 1343   MCHC 32.8 10/06/2013 0937   RDW 13.3 10/06/2013 0937   LYMPHSABS 9.2* 10/06/2013 0937   MONOABS 0.6 10/06/2013 0937   EOSABS 0.3 10/06/2013 0937   BASOSABS 0.1 10/06/2013 0937     BNP No results found for: PROBNP  Lipid Panel  No results found for: CHOL   RADIOLOGY: No  results found.   ASSESSMENT AND PLAN:   Colleen Moreno is a 63 year old female who has a history of in situ ductal carcinoma of her left breast, as well as CLL. She has a history of significant hyperlipidemia which had been well controlled with high-dose atorvastatin therapy at 79.  Over the past year.  She had self discontinued this since her labs were normal, and most recent laboratory revealed marked elevation of her cholesterol at 322 with an LDL cholesterol of 221 and triglycerides of 295.  She is now back on atorvastatin.  I have suggested follow-up laboratory including gait see med as well as lipid panel.  I also suggested a follow-up CBC to further evaluate her CLL.  She denies myalgias with reinstitution of her lipid lowering therapy is also on low vase a 2 capsules daily.  Her blood pressure today is controlled. I also have suggested resumption of 81 mg aspirin.  Her GERD is controlled with excellent and she is tolerating this well.  She has gained approximately 12 pounds since last year, but her weight is still significantly improved from when she had weighed 212 pounds.  She tells me she will be going back to [facility] for her follow-up laboratory.  I will last that these be forwarded to my review office for my review.  Adjustments will be made if necessary to medical regimen.  I will see her in one year for cardiology reevaluation.  Time spent: 25 minutes  Troy Sine, MD, [facility] 08/01/2014 2:16 PM

## 2014-10-11 ENCOUNTER — Other Ambulatory Visit: Payer: Self-pay | Admitting: *Deleted

## 2014-10-11 DIAGNOSIS — D0510 Intraductal carcinoma in situ of unspecified breast: Secondary | ICD-10-CM

## 2014-10-12 ENCOUNTER — Ambulatory Visit (HOSPITAL_BASED_OUTPATIENT_CLINIC_OR_DEPARTMENT_OTHER): Payer: Medicare Other | Admitting: Hematology and Oncology

## 2014-10-12 ENCOUNTER — Other Ambulatory Visit (HOSPITAL_BASED_OUTPATIENT_CLINIC_OR_DEPARTMENT_OTHER): Payer: Medicare Other

## 2014-10-12 ENCOUNTER — Telehealth: Payer: Self-pay | Admitting: Hematology and Oncology

## 2014-10-12 ENCOUNTER — Other Ambulatory Visit: Payer: Federal, State, Local not specified - PPO

## 2014-10-12 ENCOUNTER — Ambulatory Visit: Payer: Federal, State, Local not specified - PPO | Admitting: Oncology

## 2014-10-12 VITALS — BP 144/79 | HR 74 | Temp 98.2°F | Resp 19 | Ht 70.0 in | Wt 174.5 lb

## 2014-10-12 DIAGNOSIS — C50912 Malignant neoplasm of unspecified site of left female breast: Secondary | ICD-10-CM

## 2014-10-12 DIAGNOSIS — D0512 Intraductal carcinoma in situ of left breast: Secondary | ICD-10-CM

## 2014-10-12 DIAGNOSIS — D0510 Intraductal carcinoma in situ of unspecified breast: Secondary | ICD-10-CM

## 2014-10-12 DIAGNOSIS — C50919 Malignant neoplasm of unspecified site of unspecified female breast: Secondary | ICD-10-CM

## 2014-10-12 DIAGNOSIS — C9110 Chronic lymphocytic leukemia of B-cell type not having achieved remission: Secondary | ICD-10-CM | POA: Diagnosis not present

## 2014-10-12 DIAGNOSIS — C50911 Malignant neoplasm of unspecified site of right female breast: Secondary | ICD-10-CM

## 2014-10-12 LAB — CBC WITH DIFFERENTIAL/PLATELET
BASO%: 0.4 % (ref 0.0–2.0)
Basophils Absolute: 0.1 10*3/uL (ref 0.0–0.1)
EOS ABS: 0.5 10*3/uL (ref 0.0–0.5)
EOS%: 3.3 % (ref 0.0–7.0)
HCT: 44.5 % (ref 34.8–46.6)
HGB: 14.3 g/dL (ref 11.6–15.9)
LYMPH#: 11.2 10*3/uL — AB (ref 0.9–3.3)
LYMPH%: 71.2 % — ABNORMAL HIGH (ref 14.0–49.7)
MCH: 29.5 pg (ref 25.1–34.0)
MCHC: 32.1 g/dL (ref 31.5–36.0)
MCV: 91.9 fL (ref 79.5–101.0)
MONO#: 0.6 10*3/uL (ref 0.1–0.9)
MONO%: 3.9 % (ref 0.0–14.0)
NEUT#: 3.3 10*3/uL (ref 1.5–6.5)
NEUT%: 21.2 % — ABNORMAL LOW (ref 38.4–76.8)
Platelets: 339 10*3/uL (ref 145–400)
RBC: 4.85 10*6/uL (ref 3.70–5.45)
RDW: 13.5 % (ref 11.2–14.5)
WBC: 15.8 10*3/uL — AB (ref 3.9–10.3)

## 2014-10-12 LAB — COMPREHENSIVE METABOLIC PANEL (CC13)
ALK PHOS: 91 U/L (ref 40–150)
ALT: 19 U/L (ref 0–55)
ANION GAP: 12 meq/L — AB (ref 3–11)
AST: 15 U/L (ref 5–34)
Albumin: 4.1 g/dL (ref 3.5–5.0)
BUN: 10.7 mg/dL (ref 7.0–26.0)
CALCIUM: 9.2 mg/dL (ref 8.4–10.4)
CHLORIDE: 110 meq/L — AB (ref 98–109)
CO2: 21 mEq/L — ABNORMAL LOW (ref 22–29)
Creatinine: 0.8 mg/dL (ref 0.6–1.1)
EGFR: 76 mL/min/{1.73_m2} — AB (ref >90)
GLUCOSE: 108 mg/dL (ref 70–140)
Potassium: 3.8 mEq/L (ref 3.5–5.1)
Sodium: 143 mEq/L (ref 136–145)
Total Bilirubin: 1.16 mg/dL (ref 0.20–1.20)
Total Protein: 6.3 g/dL — ABNORMAL LOW (ref 6.4–8.3)

## 2014-10-12 LAB — TECHNOLOGIST REVIEW

## 2014-10-12 MED ORDER — VENLAFAXINE HCL ER 37.5 MG PO CP24: 37.5000 mg | ORAL_CAPSULE | Freq: Every day | ORAL | Status: DC

## 2014-10-12 NOTE — Assessment & Plan Note (Signed)
CLL stage I: Patient had small lymphadenopathy that has been fairly stable. Absolute lymphocyte count today is extremely stable. Patient does not have any evidence of anemia or thrombus cytopenia or any other symptoms or signs of progression of CLL. Continue annual follow-up with blood counts.  Depression: Patient has been very depressed because of situation with her husband where he has been going through a lot of ill health. She appears to be crying at home and feels depressed. I prescribed her Effexor XR once daily to help her symptoms.

## 2014-10-12 NOTE — Assessment & Plan Note (Signed)
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 Breast examination done 10/12/2014 is normal  Return to clinic in 1 year for follow-up

## 2014-10-12 NOTE — Progress Notes (Signed)
Patient Care Team: Orpah Melter, MD as PCP - General (Family Medicine) Deatra Robinson, MD (Internal Medicine) Blair Promise, MD (Radiation Oncology) Haywood Lasso, MD as Surgeon (General Surgery) Cyril Mourning, MD (Obstetrics and Gynecology)  DIAGNOSIS: No matching staging information was found for the patient.  SUMMARY OF ONCOLOGIC HISTORY:   Cancer of left breast   05/28/2011 Initial Diagnosis DCIS left breast ER/PR positive   06/11/2011 Surgery Left breast lumpectomy: High-grade DCIS, ER 76%, PR 86%   07/11/2011 - 08/11/2011 Radiation Therapy Adjuvant radiation therapy   08/14/2011 - 02/10/2012 Anti-estrogen oral therapy Exemestane 25 mg daily caused severe weight loss and discontinued    CHIEF COMPLIANT: Follow-up of CLL and DCIS  INTERVAL HISTORY: Colleen Moreno is a 64 year old lady with the above-mentioned history of DCIS involving the left breast diagnosed September 2012 treated with lumpectomy and radiation but could not tolerate Aromasin because of weight loss and she discontinued it. She also has history of CLL which has been stable. CLL was first diagnosed when she underwent evaluation for the breast cancer and ultrasound detected subtle lymphadenopathy which are attributable to CLL. She denies any new problems or concerns. She reports that in July 2015 [facility] to biopsy because of her abnormal calcifications in the mammogram.  REVIEW OF SYSTEMS:   Constitutional: Denies fevers, chills or abnormal weight loss Eyes: Denies blurriness of vision Ears, nose, mouth, throat, and face: Denies mucositis or sore throat Respiratory: Denies cough, dyspnea or wheezes Cardiovascular: Denies palpitation, chest discomfort or lower extremity swelling Gastrointestinal:  Denies nausea, heartburn or change in bowel habits Skin: Denies abnormal skin rashes Lymphatics: Denies new lymphadenopathy or easy bruising Neurological:Denies numbness, tingling or new weaknesses Behavioral/Psych:  Complains of depression and tearfulness with regards to her husband's ill health  Breast:  denies any pain or lumps or nodules in either breasts All other systems were reviewed with the patient and are negative.  I have reviewed the past medical history, past surgical history, social history and family history with the patient and they are unchanged from previous note.  ALLERGIES:  has No Known Allergies.  MEDICATIONS:  Current Outpatient Prescriptions  Medication Sig Dispense Refill  . atorvastatin (LIPITOR) 80 MG tablet Take 80 mg by mouth daily.    . cetirizine (ZYRTEC) 10 MG tablet Take 10 mg by mouth daily.    Marland Kitchen DEXILANT 60 MG capsule Take 60 mg by mouth daily.     Marland Kitchen omega-3 acid ethyl esters (LOVAZA) 1 G capsule Take 2 g by mouth 2 (two) times daily.    Marland Kitchen venlafaxine XR (EFFEXOR-XR) 37.5 MG 24 hr capsule Take 1 capsule (37.5 mg total) by mouth daily with breakfast. 30 capsule 5   No current facility-administered medications for this visit.    PHYSICAL EXAMINATION: ECOG PERFORMANCE STATUS: 0 - Asymptomatic  Filed Vitals:   10/12/14 1330  BP: 144/79  Pulse: 74  Temp: 98.2 F (36.8 C)  Resp: 19   Filed Weights   10/12/14 1330  Weight: 174 lb 8 oz (79.153 kg)    GENERAL:alert, no distress and comfortable SKIN: skin color, texture, turgor are normal, no rashes or significant lesions EYES: normal, Conjunctiva are pink and non-injected, sclera clear OROPHARYNX:no exudate, no erythema and lips, buccal mucosa, and tongue normal  NECK: supple, thyroid normal size, non-tender, without nodularity LYMPH:  no palpable lymphadenopathy in the cervical, axillary or inguinal LUNGS: clear to auscultation and percussion with normal breathing effort HEART: regular rate & rhythm and no murmurs and  no lower extremity edema ABDOMEN:abdomen soft, non-tender and normal bowel sounds Musculoskeletal:no cyanosis of digits and no clubbing  NEURO: alert & oriented x 3 with fluent speech, no  focal motor/sensory deficits BREAST: No palpable masses or nodules in either right or left breasts. No palpable axillary supraclavicular or infraclavicular adenopathy no breast tenderness or nipple discharge. (exam performed in the presence of a chaperone)  LABORATORY DATA:  I have reviewed the data as listed   Chemistry      Component Value Date/Time   NA 143 10/12/2014 1302   NA 143 12/01/2011 1103   K 3.8 10/12/2014 1302   K 4.0 12/01/2011 1103   CL 106 12/01/2012 1108   CL 107 12/01/2011 1103   CO2 21* 10/12/2014 1302   CO2 23 12/01/2011 1103   BUN 10.7 10/12/2014 1302   BUN 13 12/01/2011 1103   CREATININE 0.8 10/12/2014 1302   CREATININE 0.81 12/01/2011 1103      Component Value Date/Time   CALCIUM 9.2 10/12/2014 1302   CALCIUM 9.7 12/01/2011 1103   ALKPHOS 91 10/12/2014 1302   ALKPHOS 84 12/01/2011 1103   AST 15 10/12/2014 1302   AST 15 12/01/2011 1103   ALT 19 10/12/2014 1302   ALT 21 12/01/2011 1103   BILITOT 1.16 10/12/2014 1302   BILITOT 0.9 12/01/2011 1103       Lab Results  Component Value Date   WBC 15.8* 10/12/2014   HGB 14.3 10/12/2014   HCT 44.5 10/12/2014   MCV 91.9 10/12/2014   PLT 339 10/12/2014   NEUTROABS 3.3 10/12/2014     RADIOGRAPHIC STUDIES: I have personally reviewed the radiology reports and agreed with their findings. 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 Breast examination done 10/12/2014 is normal  Return to clinic in 1 year for follow-up     Chronic lymphocytic leukemia CLL stage I:  Patient had small lymphadenopathy that has been fairly stable. Absolute lymphocyte count today is extremely stable. Patient does not have any evidence of anemia or thrombus cytopenia or any other symptoms or signs of progression of CLL. Continue annual follow-up with blood counts.  Depression: Patient has been very depressed because of situation with her husband where he has been going through a lot of ill health. She appears to be crying at home and feels depressed. I prescribed her Effexor XR once daily to help her symptoms.    Orders Placed This Encounter  Procedures  . CBC with Differential    Standing Status: Future     Number of Occurrences:      Standing Expiration Date: 10/12/2015  . Comprehensive metabolic panel (Cmet) - [facility]    Standing Status: Future     Number of Occurrences:      Standing Expiration Date: 10/12/2015   The patient has a good understanding of the overall plan. she agrees with it. She will call with any problems that may develop before her next visit here.   Rulon Eisenmenger, MD

## 2014-10-12 NOTE — Telephone Encounter (Signed)
per pof to sch pt appt-gave pt copy of sch °

## 2014-11-21 DIAGNOSIS — C9110 Chronic lymphocytic leukemia of B-cell type not having achieved remission: Secondary | ICD-10-CM | POA: Diagnosis not present

## 2014-11-21 DIAGNOSIS — K219 Gastro-esophageal reflux disease without esophagitis: Secondary | ICD-10-CM | POA: Diagnosis not present

## 2014-11-21 DIAGNOSIS — Z79899 Other long term (current) drug therapy: Secondary | ICD-10-CM | POA: Diagnosis not present

## 2014-11-21 DIAGNOSIS — E782 Mixed hyperlipidemia: Secondary | ICD-10-CM | POA: Diagnosis not present

## 2014-11-21 DIAGNOSIS — Z853 Personal history of malignant neoplasm of breast: Secondary | ICD-10-CM | POA: Diagnosis not present

## 2014-11-27 DIAGNOSIS — K219 Gastro-esophageal reflux disease without esophagitis: Secondary | ICD-10-CM | POA: Diagnosis not present

## 2014-11-27 DIAGNOSIS — J309 Allergic rhinitis, unspecified: Secondary | ICD-10-CM | POA: Diagnosis not present

## 2014-11-27 DIAGNOSIS — F419 Anxiety disorder, unspecified: Secondary | ICD-10-CM | POA: Diagnosis not present

## 2014-11-27 DIAGNOSIS — E782 Mixed hyperlipidemia: Secondary | ICD-10-CM | POA: Diagnosis not present

## 2015-02-19 ENCOUNTER — Telehealth: Payer: Self-pay | Admitting: Cardiovascular Disease

## 2015-02-19 NOTE — Telephone Encounter (Signed)
Pt called in stating that over the weekend she experienced a rapid heart beat while sitting and she says that it took an hour for the pulse rate to subside. Today she feels fine but just wanted to know if this is something she should be concerned about . Please f/u  Thanks

## 2015-02-19 NOTE — Telephone Encounter (Signed)
Can add on to my schedule this Wed 6/8 in am; may need monitor and beta blocker

## 2015-02-19 NOTE — Telephone Encounter (Signed)
Spoke with patient. She has an appointment to see Dr. Claiborne Billings on Thursday but wanted to call and inform us of her symptoms. Over the weekend, while sitting watching TV, her HR ranged from 167-182. When asked if she had any other symptoms during that time, she did not answer that question directly but stated she noticed some SOB when walking up the driveway. She has not had any more episodes. Her BP is 105/74 and HR is 80bpm today.

## 2015-02-19 NOTE — Telephone Encounter (Signed)
Patient has appointment 6/9 with Dr. Claiborne Billings

## 2015-02-21 ENCOUNTER — Encounter: Payer: Self-pay | Admitting: Cardiovascular Disease

## 2015-02-21 ENCOUNTER — Ambulatory Visit (INDEPENDENT_AMBULATORY_CARE_PROVIDER_SITE_OTHER): Payer: Medicare Other | Admitting: Cardiovascular Disease

## 2015-02-21 ENCOUNTER — Ambulatory Visit (INDEPENDENT_AMBULATORY_CARE_PROVIDER_SITE_OTHER): Payer: Medicare Other

## 2015-02-21 VITALS — BP 120/74 | HR 69 | Ht 69.0 in | Wt 174.4 lb

## 2015-02-21 DIAGNOSIS — R5383 Other fatigue: Secondary | ICD-10-CM | POA: Diagnosis not present

## 2015-02-21 DIAGNOSIS — I472 Ventricular tachycardia: Secondary | ICD-10-CM | POA: Diagnosis not present

## 2015-02-21 DIAGNOSIS — R000 Tachycardia, unspecified: Secondary | ICD-10-CM | POA: Insufficient documentation

## 2015-02-21 DIAGNOSIS — E785 Hyperlipidemia, unspecified: Secondary | ICD-10-CM

## 2015-02-21 DIAGNOSIS — C9110 Chronic lymphocytic leukemia of B-cell type not having achieved remission: Secondary | ICD-10-CM

## 2015-02-21 DIAGNOSIS — Z79899 Other long term (current) drug therapy: Secondary | ICD-10-CM

## 2015-02-21 DIAGNOSIS — Z86711 Personal history of pulmonary embolism: Secondary | ICD-10-CM

## 2015-02-21 DIAGNOSIS — R0789 Other chest pain: Secondary | ICD-10-CM | POA: Diagnosis not present

## 2015-02-21 DIAGNOSIS — R0602 Shortness of breath: Secondary | ICD-10-CM | POA: Insufficient documentation

## 2015-02-21 DIAGNOSIS — R002 Palpitations: Secondary | ICD-10-CM | POA: Diagnosis not present

## 2015-02-21 MED ORDER — METOPROLOL SUCCINATE ER 50 MG PO TB24: 50.0000 mg | ORAL_TABLET | Freq: Every day | ORAL | Status: DC

## 2015-02-21 NOTE — Patient Instructions (Signed)
Medication Instructions:   Start Metoprolol 50mg  - take 1/2 tablet daily and increase to 1 tablet daily in one week if you still have episodes of a rapid heart beat.    Labwork:  FASTING at [facility].  Testing/Procedures:  Your physician has requested that you have a lexiscan myoview. For further information please visit [email or web address]. Please follow instruction sheet, as given.  Your physician has recommended that you wear a 30 day event monitor. Event monitors are medical devices that record the heart's electrical activity. Doctors most often [place] these monitors to diagnose arrhythmias. Arrhythmias are problems with the speed or rhythm of the heartbeat. The monitor is a small, portable device. You can wear one while you do your normal daily activities. This is usually used to diagnose what is causing palpitations/syncope (passing out).    Follow-Up:  6 weeks with Dr. Claiborne Billings.  Any Other Special Instructions Will Be Listed Below (If Applicable).

## 2015-02-21 NOTE — Progress Notes (Signed)
Patient ID: Colleen Moreno, female   DOB: 1950-11-03, 64 y.o.   MRN: [id number]    HPI: Colleen Moreno is a 64 year old female who is a former patient of Dr. Rollene Fare and established cardiology care with me in 2014.  Colleen Moreno is seen as an add-on today after experiencing an episode of tachycardia this weekend with heart rates up to 167 bpm.  Colleen Moreno developed atypical chest pain while living in [place] and underwent cardiac catheterization while in [place], [place] in 2004 . Colleen Moreno was found to have normal coronaries. Following a laparoscopic cholecystectomy in 2004 Colleen Moreno developed pulmonary emboli. Colleen Moreno was on Coumadin and took this for approximally 6 months. 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 Margot Chimes in September 2012. He also has a history of CLL.  Colleen Moreno states Colleen Moreno's not had follow-up blood work in over a year.  Colleen Moreno had been seen by Dr. Chancy Milroy who has since left and will be reestablishing with a new oncologist in January 2016.  An echo Doppler study in the September 2012 showed mild concentric LVH with an ejection fraction of 50-55% with grade 1 diastolic dysfunction. Colleen Moreno did have trace mitral and tricuspid regurgitation. A nuclear perfusion study at that time showed normal perfusion and function. Ejection fraction was 58%.  The patient also has had issues with occasional GERD for which Colleen Moreno has used excellent. Colleen Moreno admits to a 50 pound weight loss with her weight being reduced from 212 pounds to approximately 160 pounds. Colleen Moreno does admit to some difficulty with fatigability and insomnia. Colleen Moreno typically is able to go to bed but he wakes up in approximately 4 hours and then it is difficult for her to go back to sleep but ultimately Colleen Moreno does fall back to sleep and wakes up approximately 9. Colleen Moreno's retired Engineer, structural.   Colleen Moreno had been on Lipitor 80 mg for some time and last year when repeat blood work showed a total cholesterol 147, HDL 46, LDL 79,  triglycerides 112, which was excellent, Colleen Moreno decided to self discontinued her Lipitor.  Colleen Moreno had follow-up blood work by Waunita Schooner, [place] on June 15 2014 which showed her total cholesterol had risen to 322 with a triglycerides of 295 and LDL cholesterol of 221.  Over the past 6 weeks.  Colleen Moreno again has started back on her atorvastatin 80 mg.  subsequent blood work in March 2018 after resuming Lipitor was March improved with a total cholesterol of 200, LDL 109, triglycerides are still elevated at 248, HDL was 42.  Colleen Moreno admits to being under increased stress.  On Sunday, while watching television.  Colleen Moreno noticed her heart rate sped up to 167 bpm.  This lasted for at least an hour and ultimately subsided.  Colleen Moreno noticed chest pressure during this episode.  Over the several weeks prior to this, Colleen Moreno has noticed some increasing shortness of breath with walking, particularly down her driveway.  Colleen Moreno now presents for evaluation.   Past Medical History  Diagnosis Date  . Clotting disorder 2006    blood clot  . 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 Allergy   . Hyperlipidemia   . CLL (chronic lymphoblastic leukemia)   . Breast cancer     left  . GERD (gastroesophageal reflux disease)     Past Surgical History  Procedure Laterality Date  . Hernia repair    . Knee arthroscopy  2006  . Gallbladder surgery  2003  .  Thyroid surgery  1979  . Mastectomy partial / lumpectomy  06/11/2011    Left    No Known Allergies  Current Outpatient Prescriptions  Medication Sig Dispense Refill  . aspirin 81 MG tablet Take 81 mg by mouth daily.    Marland Kitchen atorvastatin (LIPITOR) 80 MG tablet Take 40 mg by mouth daily.     . cetirizine (ZYRTEC) 10 MG tablet Take 10 mg by mouth daily.    . lansoprazole (PREVACID) 30 MG capsule Take 30 mg by mouth daily.  3  . montelukast (SINGULAIR) 10 MG tablet Take 10 mg by mouth daily.  3  . NASONEX 50 MCG/ACT nasal spray Inhale 2 sprays into the lungs as needed.  3  . venlafaxine  XR (EFFEXOR-XR) 37.5 MG 24 hr capsule Take 1 capsule (37.5 mg total) by mouth daily with breakfast. 30 capsule 5  . metoprolol succinate (TOPROL-XL) 50 MG 24 hr tablet Take 1 tablet (50 mg total) by mouth daily. Take with or immediately following a meal. 90 tablet 3  . omega-3 acid ethyl esters (LOVAZA) 1 G capsule Take 2 g by mouth 2 (two) times daily.     No current facility-administered medications for this visit.    Socially Colleen Moreno was born [place]. Colleen Moreno had lived in 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 At that time her husband had worked for [employer] in [place] and was transferred to [place]. Colleen Moreno has been [place] since 2004.  Family History  Problem Relation Age of Onset  . Cancer Mother     some type uterus or cervixor ovary   ROS General: Negative; No fevers, chills, or night sweats;  HEENT: Negative; No changes in vision or hearing, sinus congestion, difficulty swallowing Pulmonary: Negative; No cough, wheezing, shortness of breath, hemoptysis Cardiovascular:  See history of present illness GI: issue of GERD; No nausea, vomiting, diarrhea, or abdominal pain GU: Negative; No dysuria, hematuria, or difficulty voiding Musculoskeletal: Negative; no myalgias, joint pain, or weakness Hematologic/Oncology: Negative; no easy bruising, bleeding Endocrine: Negative; no heat/cold intolerance; no diabetes Neuro: Negative; no changes in balance, headaches Skin: Negative; No rashes or skin lesions Psychiatric: Negative; No behavioral problems, depression Sleep: history of some difficulty with sleep maintenance; No snoring, daytime sleepiness, hypersomnolence, bruxism, restless legs, hypnogognic hallucinations, no cataplexy Other comprehensive 14 point system review is negative.   PE BP 120/74 mmHg  Pulse 69  Ht '5\' 9"'  (1.753 m)  Wt 174 lb 6.4 oz (79.107 kg)  BMI 25.74 kg/m2  Wt Readings from Last 3 Encounters:  02/21/15 174 lb 6.4 oz (79.107 kg)    10/12/14 174 lb 8 oz (79.153 kg)  08/01/14 172 lb 12.8 oz (78.382 kg)   General: Alert, oriented, no distress.  Skin: normal turgor, no rashes HEENT: Normocephalic, atraumatic. Pupils round and reactive; sclera anicteric; Fundi no hemorrhages or exudates. Nose without nasal septal hypertrophy Mouth/Parynx benign; Mallinpatti scale 2/3 Neck: No JVD, no carotid bruits, normal carotid upstroke Lungs: clear to ausculatation and percussion; no wheezing or rales Chest wall: Nontender to palpation Heart: RRR, s1 s2 normal short 1/6 systolic murmur at the left sternal border. No gallop. No clicks. Abdomen: soft, nontender; no hepatosplenomehaly, BS+; abdominal aorta nontender and not dilated by palpation. Back: No CVA tenderness Pulses 2+ Extremities: no clubbinbg cyanosis or edema, Homan's sign negative  Neurologic: grossly nonfocal Psychologic: Normal mood and affect  ECG (independently read by me): Normal sinus rhythm at 69 bpm.  Incomplete right bundle branch block.  T-wave changes II, III, and F V4 through V6.  November 2015 ECG (independently read by me): Normal sinus rhythm.  Incomplete right bundle branch block.  Poor progression.  Left axis deviation.  Nondiagnostic T changes  November 2014 ECG: Normal sinus rhythm at 76 beats per minute. Poor R wave progression anteriorly. Normal intervals.  LABS: BMP Latest Ref Rng 10/12/2014 10/06/2013 04/05/2013  Glucose 70 - 140 mg/dl 108 100 88  BUN 7.0 - 26.0 mg/dL 10.7 11.6 7.7  Creatinine 0.6 - 1.1 mg/dL 0.8 0.8 0.8  Sodium 136 - 145 mEq/L 143 144 143  Potassium 3.5 - 5.1 mEq/L 3.8 3.9 4.0  Chloride 98 - 107 mEq/L - - -  CO2 22 - 29 mEq/L 21(L) 26 28  Calcium 8.4 - 10.4 mg/dL 9.2 9.6 9.2   Hepatic Function Latest Ref Rng 10/12/2014 10/06/2013 04/05/2013  Total Protein 6.4 - 8.3 g/dL 6.3(L) 6.8 6.3(L)  Albumin 3.5 - 5.0 g/dL 4.1 4.3 3.7  AST 5 - 34 U/L '15 12 13  ' ALT 0 - 55 U/L '19 10 16  ' Alk Phosphatase 40 - 150 U/L 91 60 85  Total  Bilirubin 0.20 - 1.20 mg/dL 1.16 0.84 1.04   CBC Latest Ref Rng 10/12/2014 10/06/2013 04/05/2013  WBC 3.9 - 10.3 10e3/uL 15.8(H) 13.8(H) 16.0(H)  Hemoglobin 11.6 - 15.9 g/dL 14.3 14.7 14.0  Hematocrit 34.8 - 46.6 % 44.5 44.8 41.9  Platelets 145 - 400 10e3/uL 339 303 330   Lab Results  Component Value Date   MCV 91.9 10/12/2014   MCV 93.1 10/06/2013   MCV 92.4 04/05/2013   No results found for: TSH  Lipid Panel  No results found for: CHOL, TRIG, HDL, CHOLHDL, VLDL, LDLCALC, LDLDIRECT    BNP No results found for: PROBNP  Lipid Panel  No results found for: CHOL   RADIOLOGY: No results found.   ASSESSMENT AND PLAN:  Ms. Addelynn Batte is a 64 year old female who has a history of in situ ductal carcinoma of her left breast, as well as CLL.  Colleen Moreno presents today after experiencing an episode of tachycardia which lasted over an hour and ultimately self resolved.  Prior to this episode, Colleen Moreno had noted some previous short-lived episodes of tachycardia palpitations.  Colleen Moreno also has noticed some reduction in exercise tolerance with exertional shortness shortness of breath particularly while walking up her driveway.  Presently, her EKG shows sinus rhythm without arrhythmia.  Colleen Moreno does have previously noted T-wave abnormalities.  I am starting her on Toprol 25 mg for the first week and if tolerates and if Colleen Moreno notes more palpitations to increase this to 50 mg daily.  I'm scheduling her for [facility] study to make certain her exertional shortness of breath and chest pressure is not ischemia mediated.  Laboratory will be obtained in the fasting state.  I'm scheduling her for a CardioNet monitor to wear for the next 30 days.  I reviewed laboratory from last October and from March with reference to her lipids.  In October 2014, Colleen Moreno had been off her Lipitor for 6-9 months.  This was significantly improved, but still slightly elevated when rechecked in March.  I will contact her regarding blood work  results.  I will see her back in the office in 6 weeks for reevaluation.  Time spent: 25 minutes  Troy Sine, MD, [facility] 02/21/2015 11:04 AM

## 2015-02-22 ENCOUNTER — Ambulatory Visit: Payer: Medicare Other | Admitting: Cardiovascular Disease

## 2015-02-27 DIAGNOSIS — Z79899 Other long term (current) drug therapy: Secondary | ICD-10-CM | POA: Diagnosis not present

## 2015-02-27 DIAGNOSIS — E785 Hyperlipidemia, unspecified: Secondary | ICD-10-CM | POA: Diagnosis not present

## 2015-02-27 DIAGNOSIS — R5383 Other fatigue: Secondary | ICD-10-CM | POA: Diagnosis not present

## 2015-02-28 ENCOUNTER — Telehealth (HOSPITAL_COMMUNITY): Payer: Self-pay

## 2015-02-28 LAB — LIPID PANEL
CHOLESTEROL: 215 mg/dL — AB (ref 0–200)
HDL: 41 mg/dL — ABNORMAL LOW (ref >=46)
LDL CALC: 106 mg/dL — AB (ref 0–99)
Total CHOL/HDL Ratio: 5.2 Ratio
Triglycerides: 341 mg/dL — ABNORMAL HIGH (ref <150)
VLDL: 68 mg/dL — ABNORMAL HIGH (ref 0–40)

## 2015-02-28 LAB — CBC
HCT: 44.7 % (ref 36.0–46.0)
HEMOGLOBIN: 14.7 g/dL (ref 12.0–15.0)
MCH: 30.2 pg (ref 26.0–34.0)
MCHC: 32.9 g/dL (ref 30.0–36.0)
MCV: 92 fL (ref 78.0–100.0)
MPV: 8.7 fL (ref 8.6–12.4)
PLATELETS: 348 10*3/uL (ref 150–400)
RBC: 4.86 MIL/uL (ref 3.87–5.11)
RDW: 13.6 % (ref 11.5–15.5)
WBC: 16.8 10*3/uL — AB (ref 4.0–10.5)

## 2015-02-28 LAB — COMPREHENSIVE METABOLIC PANEL
ALBUMIN: 4.5 g/dL (ref 3.5–5.2)
ALK PHOS: 78 U/L (ref 39–117)
ALT: 14 U/L (ref 0–35)
AST: 12 U/L (ref 0–37)
BILIRUBIN TOTAL: 1.1 mg/dL (ref 0.2–1.2)
BUN: 13 mg/dL (ref 6–23)
CO2: 28 mEq/L (ref 19–32)
Calcium: 9.4 mg/dL (ref 8.4–10.5)
Chloride: 106 mEq/L (ref 96–112)
Creat: 0.73 mg/dL (ref 0.50–1.10)
Glucose, Bld: 83 mg/dL (ref 70–99)
Potassium: 4.5 mEq/L (ref 3.5–5.3)
SODIUM: 141 meq/L (ref 135–145)
TOTAL PROTEIN: 6.3 g/dL (ref 6.0–8.3)

## 2015-02-28 NOTE — Telephone Encounter (Signed)
Encounter complete. 

## 2015-03-01 ENCOUNTER — Telehealth (HOSPITAL_COMMUNITY): Payer: Self-pay

## 2015-03-01 NOTE — Telephone Encounter (Signed)
Encounter complete. 

## 2015-03-02 ENCOUNTER — Telehealth: Payer: Self-pay | Admitting: Cardiovascular Disease

## 2015-03-02 ENCOUNTER — Other Ambulatory Visit: Payer: Self-pay | Admitting: *Deleted

## 2015-03-02 ENCOUNTER — Telehealth: Payer: Self-pay | Admitting: *Deleted

## 2015-03-02 ENCOUNTER — Ambulatory Visit (HOSPITAL_COMMUNITY)
Admission: RE | Admit: 2015-03-02 | Discharge: 2015-03-02 | Disposition: A | Discharge status: Home / Self Care | Payer: Medicare Other | Source: Ambulatory Visit | Attending: Cardiovascular Disease | Admitting: Cardiovascular Disease

## 2015-03-02 DIAGNOSIS — R0789 Other chest pain: Secondary | ICD-10-CM | POA: Insufficient documentation

## 2015-03-02 LAB — MYOCARDIAL PERFUSION IMAGING
CHL CUP NUCLEAR SDS: 2
CHL CUP NUCLEAR SSS: 4
CHL CUP RESTING HR STRESS: 49 {beats}/min
LV sys vol: 68 mL
LVDIAVOL: 146 mL
Nuc Stress EF: 53 %
Peak HR: 74 {beats}/min
SRS: 2
TID: 1.14

## 2015-03-02 MED ORDER — FENOFIBRIC ACID 105 MG PO TABS: 1.0000 | ORAL_TABLET | Freq: Every day | ORAL | Status: DC

## 2015-03-02 MED ORDER — REGADENOSON 0.4 MG/5ML IV SOLN
0.4000 mg | Freq: Once | INTRAVENOUS | Status: AC
  Administered 2015-03-02: 0.4 mg via INTRAVENOUS

## 2015-03-02 MED ORDER — AMINOPHYLLINE 25 MG/ML IV SOLN
75.0000 mg | Freq: Once | INTRAVENOUS | Status: AC
  Administered 2015-03-02: 75 mg via INTRAVENOUS

## 2015-03-02 MED ORDER — TECHNETIUM TC 99M SESTAMIBI GENERIC - CARDIOLITE
31.2000 | Freq: Once | INTRAVENOUS | Status: AC | PRN
  Administered 2015-03-02: 31.2 via INTRAVENOUS

## 2015-03-02 MED ORDER — TECHNETIUM TC 99M SESTAMIBI GENERIC - CARDIOLITE
10.8000 | Freq: Once | INTRAVENOUS | Status: AC | PRN
  Administered 2015-03-02: 11 via INTRAVENOUS

## 2015-03-02 NOTE — Telephone Encounter (Signed)
Mrs. Alarid is returning your call , Please call . Thanks

## 2015-03-02 NOTE — Telephone Encounter (Signed)
-----   Message from Troy Sine, MD sent at 02/28/2015  5:55 PM EDT ----- Is she still taking her lipitor 80 mg? Add fenofibric acid 105 or 135 mg

## 2015-03-02 NOTE — Telephone Encounter (Signed)
Add the fenofibrate to atorvastatin

## 2015-03-02 NOTE — Telephone Encounter (Signed)
Spoke with patient informing her of lab results and recommendations. Patient states that she is only taking 40 mg of atorvastatin. Message routed to Dr. Claiborne Billings for review and further recommendations.

## 2015-03-02 NOTE — Telephone Encounter (Signed)
Patient notified of recommendations. fenofibric acid 105 sent to patient's pharmacy.

## 2015-03-07 ENCOUNTER — Telehealth: Payer: Self-pay | Admitting: Cardiovascular Disease

## 2015-03-07 NOTE — Telephone Encounter (Signed)
Returned call to patient she stated she is at [place] with her husband.Stated he has to stay for 2 more days.Stated she needs 2 tablets of metoprolol called to [facility].Metoprolol Succ 50 mg take 1/2 tablet daily # 2 tablets phoned into [facility] at # [phone].

## 2015-03-07 NOTE — Telephone Encounter (Signed)
Pt husband is in the hospital in ,she forgot her medicine. Pt says she does not know the name of the medicine she needs. It is the medicine he put her on when she had the stress test. She was told not to miss a pill. Please call #3 pills to [facility].Please call pt when they are called in. Thank you very much.

## 2015-03-09 NOTE — Telephone Encounter (Signed)
This is not a TOC call - patient has not been in a [place].   Message routed to scheduler regarding appointment

## 2015-03-12 ENCOUNTER — Other Ambulatory Visit: Payer: Self-pay

## 2015-03-15 ENCOUNTER — Ambulatory Visit: Payer: Medicare Other | Admitting: Cardiovascular Disease

## 2015-03-29 ENCOUNTER — Encounter: Payer: Self-pay | Admitting: Cardiovascular Disease

## 2015-04-24 ENCOUNTER — Other Ambulatory Visit: Payer: Self-pay | Admitting: Obstetrics and Gynecology

## 2015-04-24 DIAGNOSIS — Z853 Personal history of malignant neoplasm of breast: Secondary | ICD-10-CM

## 2015-05-24 ENCOUNTER — Ambulatory Visit
Admission: RE | Admit: 2015-05-24 | Discharge: 2015-05-24 | Disposition: A | Discharge status: Home / Self Care | Payer: Medicare Other | Source: Ambulatory Visit | Attending: Obstetrics and Gynecology | Admitting: Obstetrics and Gynecology

## 2015-05-24 DIAGNOSIS — R922 Inconclusive mammogram: Secondary | ICD-10-CM | POA: Diagnosis not present

## 2015-05-24 DIAGNOSIS — Z853 Personal history of malignant neoplasm of breast: Secondary | ICD-10-CM

## 2015-05-24 DIAGNOSIS — Z803 Family history of malignant neoplasm of breast: Secondary | ICD-10-CM | POA: Diagnosis not present

## 2015-06-26 DIAGNOSIS — D485 Neoplasm of uncertain behavior of skin: Secondary | ICD-10-CM | POA: Diagnosis not present

## 2015-06-26 DIAGNOSIS — D225 Melanocytic nevi of trunk: Secondary | ICD-10-CM | POA: Diagnosis not present

## 2015-06-26 DIAGNOSIS — L821 Other seborrheic keratosis: Secondary | ICD-10-CM | POA: Diagnosis not present

## 2015-06-26 DIAGNOSIS — L814 Other melanin hyperpigmentation: Secondary | ICD-10-CM | POA: Diagnosis not present

## 2015-06-26 DIAGNOSIS — B079 Viral wart, unspecified: Secondary | ICD-10-CM | POA: Diagnosis not present

## 2015-06-26 DIAGNOSIS — D1800 Hemangioma unspecified site: Secondary | ICD-10-CM | POA: Diagnosis not present

## 2015-06-26 DIAGNOSIS — Z86018 Personal history of other benign neoplasm: Secondary | ICD-10-CM | POA: Diagnosis not present

## 2015-09-11 DIAGNOSIS — M25552 Pain in left hip: Secondary | ICD-10-CM | POA: Diagnosis not present

## 2015-09-18 DIAGNOSIS — M1612 Unilateral primary osteoarthritis, left hip: Secondary | ICD-10-CM | POA: Diagnosis not present

## 2015-09-18 DIAGNOSIS — M25552 Pain in left hip: Secondary | ICD-10-CM | POA: Diagnosis not present

## 2015-09-18 DIAGNOSIS — R1030 Lower abdominal pain, unspecified: Secondary | ICD-10-CM | POA: Diagnosis not present

## 2015-10-02 DIAGNOSIS — M1612 Unilateral primary osteoarthritis, left hip: Secondary | ICD-10-CM | POA: Diagnosis not present

## 2015-10-12 IMAGING — NM NM MISC PROCEDURE
6 series · 36 of 36 positions shown · non-contrast
Comparison: none

[Series 1: wbr rest · 6.40mm/px · 6 of 64 frames shown]
[frame 6/64]
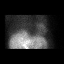
[frame 16/64]
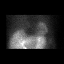
[frame 27/64]
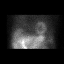
[frame 38/64]
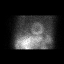
[frame 48/64]
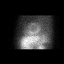
[frame 59/64]
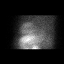

[Series 1: wbr_r-proj_st wbr rest · 6.40mm/px · 6 of 64 frames shown]
[frame 6/64]
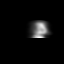
[frame 16/64]
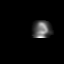
[frame 27/64]
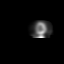
[frame 38/64]
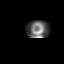
[frame 48/64]
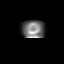
[frame 59/64]
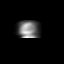

[Series 2: wbr_s-proj_st wbr stress-gsp · 6.40mm/px · 6 of 512 frames shown]
[frame 43/512]
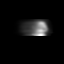
[frame 128/512]
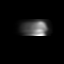
[frame 214/512]
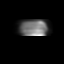
[frame 299/512]
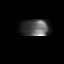
[frame 384/512]
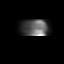
[frame 470/512]
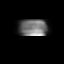

[Series 2: wbr stress-gsp · 6.40mm/px · 6 of 511 frames shown]
[frame 43/511]
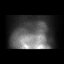
[frame 128/511]
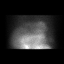
[frame 213/511]
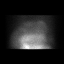
[frame 298/511]
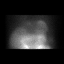
[frame 383/511]
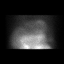
[frame 469/511]
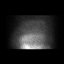

[Series 3: wbr stress-sum-em · 6.40mm/px · 6 of 64 frames shown]
[frame 6/64]
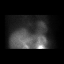
[frame 16/64]
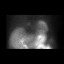
[frame 27/64]
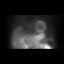
[frame 38/64]
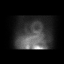
[frame 48/64]
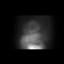
[frame 59/64]
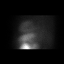

[Series 3: wbr_s-proj_st wbr stress-sum-em · 6.40mm/px · 6 of 64 frames shown]
[frame 6/64]
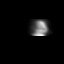
[frame 16/64]
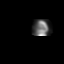
[frame 27/64]
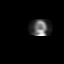
[frame 38/64]
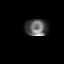
[frame 48/64]
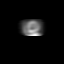
[frame 59/64]
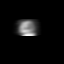

[36 of 36 positions shown; findings below may reference images not displayed]

Canned report from images found in remote index.

Refer to host system for actual result text.

## 2015-10-14 NOTE — Assessment & Plan Note (Signed)
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 Breast examination done 10/15/2015 is normal  Return to clinic in 1 year for follow-up

## 2015-10-14 NOTE — Assessment & Plan Note (Signed)
CLL stage I: Patient had small lymphadenopathy that has been fairly stable. Absolute lymphocyte count today is extremely stable. Patient does not have any evidence of anemia or thrombus cytopenia or any other symptoms or signs of progression of CLL. Continue annual follow-up with blood counts.  Depression: Patient has been very depressed because of situation with her husband where he has been going through a lot of ill health. She appears to be crying at home and feels depressed. I prescribed her Effexor XR once daily to help her symptoms. 

## 2015-10-15 ENCOUNTER — Other Ambulatory Visit (HOSPITAL_BASED_OUTPATIENT_CLINIC_OR_DEPARTMENT_OTHER): Payer: Medicare Other

## 2015-10-15 ENCOUNTER — Ambulatory Visit (HOSPITAL_BASED_OUTPATIENT_CLINIC_OR_DEPARTMENT_OTHER): Payer: Medicare Other | Admitting: Hematology and Oncology

## 2015-10-15 ENCOUNTER — Other Ambulatory Visit: Payer: Self-pay

## 2015-10-15 ENCOUNTER — Encounter: Payer: Self-pay | Admitting: Hematology and Oncology

## 2015-10-15 VITALS — BP 152/88 | HR 93 | Temp 98.0°F | Resp 18 | Ht 69.0 in | Wt 181.5 lb

## 2015-10-15 DIAGNOSIS — C50512 Malignant neoplasm of lower-outer quadrant of left female breast: Secondary | ICD-10-CM

## 2015-10-15 DIAGNOSIS — C9110 Chronic lymphocytic leukemia of B-cell type not having achieved remission: Secondary | ICD-10-CM

## 2015-10-15 DIAGNOSIS — Z853 Personal history of malignant neoplasm of breast: Secondary | ICD-10-CM

## 2015-10-15 DIAGNOSIS — F329 Major depressive disorder, single episode, unspecified: Secondary | ICD-10-CM

## 2015-10-15 LAB — CBC WITH DIFFERENTIAL/PLATELET
BASO%: 1.1 % (ref 0.0–2.0)
Basophils Absolute: 0.2 10*3/uL — ABNORMAL HIGH (ref 0.0–0.1)
EOS ABS: 0.6 10*3/uL — AB (ref 0.0–0.5)
EOS%: 3.8 % (ref 0.0–7.0)
HCT: 46.9 % — ABNORMAL HIGH (ref 34.8–46.6)
HEMOGLOBIN: 15.4 g/dL (ref 11.6–15.9)
LYMPH%: 69.7 % — ABNORMAL HIGH (ref 14.0–49.7)
MCH: 29.6 pg (ref 25.1–34.0)
MCHC: 32.8 g/dL (ref 31.5–36.0)
MCV: 90.1 fL (ref 79.5–101.0)
MONO#: 0.7 10*3/uL (ref 0.1–0.9)
MONO%: 4.8 % (ref 0.0–14.0)
NEUT%: 20.6 % — ABNORMAL LOW (ref 38.4–76.8)
NEUTROS ABS: 3 10*3/uL (ref 1.5–6.5)
PLATELETS: 358 10*3/uL (ref 145–400)
RBC: 5.2 10*6/uL (ref 3.70–5.45)
RDW: 13.7 % (ref 11.2–14.5)
WBC: 14.7 10*3/uL — AB (ref 3.9–10.3)
lymph#: 10.2 10*3/uL — ABNORMAL HIGH (ref 0.9–3.3)

## 2015-10-15 LAB — LACTATE DEHYDROGENASE: LDH: 164 U/L (ref 125–245)

## 2015-10-15 LAB — COMPREHENSIVE METABOLIC PANEL
ALBUMIN: 4.3 g/dL (ref 3.5–5.0)
ALK PHOS: 90 U/L (ref 40–150)
ALT: 11 U/L (ref 0–55)
ANION GAP: 12 meq/L — AB (ref 3–11)
AST: 10 U/L (ref 5–34)
BILIRUBIN TOTAL: 0.41 mg/dL (ref 0.20–1.20)
BUN: 12.5 mg/dL (ref 7.0–26.0)
CO2: 23 mEq/L (ref 22–29)
Calcium: 9.7 mg/dL (ref 8.4–10.4)
Chloride: 111 mEq/L — ABNORMAL HIGH (ref 98–109)
Creatinine: 0.9 mg/dL (ref 0.6–1.1)
EGFR: 68 mL/min/{1.73_m2} — ABNORMAL LOW (ref >90)
GLUCOSE: 90 mg/dL (ref 70–140)
Potassium: 4.2 mEq/L (ref 3.5–5.1)
Sodium: 146 mEq/L — ABNORMAL HIGH (ref 136–145)
TOTAL PROTEIN: 7.2 g/dL (ref 6.4–8.3)

## 2015-10-15 LAB — TECHNOLOGIST REVIEW

## 2015-10-15 NOTE — Progress Notes (Signed)
Patient Care Team: Orpah Melter, MD as PCP - General (Family Medicine) Consuela Mimes, MD (Internal Medicine) Gery Pray, MD (Radiation Oncology) Neldon Mc, MD as Surgeon (General Surgery) Dian Queen, MD (Obstetrics and Gynecology)  DIAGNOSIS: left breast cancer and CLL  SUMMARY OF ONCOLOGIC HISTORY:   Breast cancer of lower-outer quadrant of left female breast ([place])   05/28/2011 Initial Diagnosis DCIS left breast ER/PR positive   06/11/2011 Surgery Left breast lumpectomy: High-grade DCIS, ER 76%, PR 86%   07/11/2011 - 08/11/2011 Radiation Therapy Adjuvant radiation therapy   08/14/2011 - 02/10/2012 Anti-estrogen oral therapy Exemestane 25 mg daily caused severe weight loss and discontinued    CHIEF COMPLIANT:  Follow-up of breast cancer and CLL  INTERVAL HISTORY: Colleen Moreno is a 65 year old above-mentioned history of left breast cancer and CLL was currently here for a routine checkup. She is not currently on any antiestrogen therapy. This was discontinued because of weight gain issues in May 2013. She denies any new problems lumps or nodules in the breasts.  REVIEW OF SYSTEMS:   Constitutional: Denies fevers, chills or abnormal weight loss Eyes: Denies blurriness of vision Ears, nose, mouth, throat, and face: Denies mucositis or sore throat Respiratory: Denies cough, dyspnea or wheezes Cardiovascular: Denies palpitation, chest discomfort Gastrointestinal:  Denies nausea, heartburn or change in bowel habits Skin: Denies abnormal skin rashes Lymphatics: Denies new lymphadenopathy or easy bruising Neurological:Denies numbness, tingling or new weaknesses Behavioral/Psych: Mood is stable, no new changes  Extremities: No lower extremity edema Breast:  denies any pain or lumps or nodules in either breasts All other systems were reviewed with the patient and are negative.  I have reviewed the past medical history, past surgical history, social history and family  history with the patient and they are unchanged from previous note.  ALLERGIES:  has No Known Allergies.  MEDICATIONS:  Current Outpatient Prescriptions  Medication Sig Dispense Refill  . cetirizine (ZYRTEC) 10 MG tablet Take 10 mg by mouth daily.    . lansoprazole (PREVACID) 30 MG capsule Take 30 mg by mouth daily.  3  . atorvastatin (LIPITOR) 80 MG tablet Take 40 mg by mouth daily. Reported on 10/15/2015    . NASONEX 50 MCG/ACT nasal spray Inhale 2 sprays into the lungs as needed. Reported on 10/15/2015  3  . traMADol (ULTRAM) 50 MG tablet Take 50 mg by mouth every 6 (six) hours as needed. Reported on 10/15/2015  0   No current facility-administered medications for this visit.    PHYSICAL EXAMINATION: ECOG PERFORMANCE STATUS: 1 - Symptomatic but completely ambulatory  Filed Vitals:   10/15/15 1310  BP: 152/88  Pulse: 93  Temp: 98 F (36.7 C)  Resp: 18   Filed Weights   10/15/15 1310  Weight: 181 lb 8 oz (82.328 kg)    GENERAL:alert, no distress and comfortable SKIN: skin color, texture, turgor are normal, no rashes or significant lesions EYES: normal, Conjunctiva are pink and non-injected, sclera clear OROPHARYNX:no exudate, no erythema and lips, buccal mucosa, and tongue normal  NECK: supple, thyroid normal size, non-tender, without nodularity LYMPH:  no palpable lymphadenopathy in the cervical, axillary or inguinal LUNGS: clear to auscultation and percussion with normal breathing effort HEART: regular rate & rhythm and no murmurs and no lower extremity edema ABDOMEN:abdomen soft, non-tender and normal bowel sounds MUSCULOSKELETAL:no cyanosis of digits and no clubbing  NEURO: alert & oriented x 3 with fluent speech, no focal motor/sensory deficits EXTREMITIES: No lower extremity edema BREAST: No palpable masses or nodules  in either right or left breasts. No palpable axillary supraclavicular or infraclavicular adenopathy no breast tenderness or nipple discharge. (exam  performed in the presence of a chaperone)  LABORATORY DATA:  I have reviewed the data as listed   Chemistry      Component Value Date/Time   NA 141 02/27/2015 0951   NA 143 10/12/2014 1302   K 4.5 02/27/2015 0951   K 3.8 10/12/2014 1302   CL 106 02/27/2015 0951   CL 106 12/01/2012 1108   CO2 28 02/27/2015 0951   CO2 21* 10/12/2014 1302   BUN 13 02/27/2015 0951   BUN 10.7 10/12/2014 1302   CREATININE 0.73 02/27/2015 0951   CREATININE 0.8 10/12/2014 1302   CREATININE 0.81 12/01/2011 1103      Component Value Date/Time   CALCIUM 9.4 02/27/2015 0951   CALCIUM 9.2 10/12/2014 1302   ALKPHOS 78 02/27/2015 0951   ALKPHOS 91 10/12/2014 1302   AST 12 02/27/2015 0951   AST 15 10/12/2014 1302   ALT 14 02/27/2015 0951   ALT 19 10/12/2014 1302   BILITOT 1.1 02/27/2015 0951   BILITOT 1.16 10/12/2014 1302       Lab Results  Component Value Date   WBC 14.7* 10/15/2015   HGB 15.4 10/15/2015   HCT 46.9* 10/15/2015   MCV 90.1 10/15/2015   PLT 358 10/15/2015   NEUTROABS 3.0 10/15/2015   ASSESSMENT & PLAN:  Breast cancer of lower-outer quadrant of left female breast ([place]) 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 Breast examination done 10/15/2015 is normal  Chronic lymphocytic leukemia CLL stage I: Patient had small lymphadenopathy that has been fairly stable. Absolute lymphocyte count today is extremely stable. Patient does not have any evidence of anemia or thrombocytopenia or any other symptoms or signs of progression of CLL. Continue annual follow-up with blood counts.  Depression: On Effexor XR once daily to help her  symptoms.  Return to clinic in 1 year for follow-up. It appears that the patient would like to follow with the primary care physician for her blood count check as well as her breast cancer follow-ups. However at this point she would like to keep the appointment 1 year. If she changes her mind, she will call and cancel my follow-ups. She did not want to transition to survivorship clinic.   No orders of the defined types were placed in this encounter.   The patient has a good understanding of the overall plan. she agrees with it. she will call with any problems that may develop before the next visit here.   Rulon Eisenmenger, MD 10/15/2015

## 2015-10-16 DIAGNOSIS — M1030 Gout due to renal impairment, unspecified site: Secondary | ICD-10-CM | POA: Diagnosis not present

## 2015-10-16 DIAGNOSIS — M1612 Unilateral primary osteoarthritis, left hip: Secondary | ICD-10-CM | POA: Diagnosis not present

## 2015-10-16 DIAGNOSIS — R1032 Left lower quadrant pain: Secondary | ICD-10-CM | POA: Diagnosis not present

## 2015-10-16 DIAGNOSIS — M25552 Pain in left hip: Secondary | ICD-10-CM | POA: Diagnosis not present

## 2015-10-17 ENCOUNTER — Telehealth: Payer: Self-pay | Admitting: Hematology and Oncology

## 2015-10-17 NOTE — Telephone Encounter (Signed)
Spoke with patient and she is aware of her one year appt °

## 2015-12-12 DIAGNOSIS — G479 Sleep disorder, unspecified: Secondary | ICD-10-CM | POA: Diagnosis not present

## 2015-12-12 DIAGNOSIS — K219 Gastro-esophageal reflux disease without esophagitis: Secondary | ICD-10-CM | POA: Diagnosis not present

## 2015-12-12 DIAGNOSIS — J309 Allergic rhinitis, unspecified: Secondary | ICD-10-CM | POA: Diagnosis not present

## 2015-12-12 DIAGNOSIS — F411 Generalized anxiety disorder: Secondary | ICD-10-CM | POA: Diagnosis not present

## 2015-12-12 DIAGNOSIS — E782 Mixed hyperlipidemia: Secondary | ICD-10-CM | POA: Diagnosis not present

## 2015-12-12 DIAGNOSIS — Z23 Encounter for immunization: Secondary | ICD-10-CM | POA: Diagnosis not present

## 2015-12-12 DIAGNOSIS — C9110 Chronic lymphocytic leukemia of B-cell type not having achieved remission: Secondary | ICD-10-CM | POA: Diagnosis not present

## 2015-12-12 DIAGNOSIS — M25552 Pain in left hip: Secondary | ICD-10-CM | POA: Diagnosis not present

## 2015-12-26 ENCOUNTER — Other Ambulatory Visit: Payer: Self-pay | Admitting: Sports Medicine

## 2015-12-26 DIAGNOSIS — M1612 Unilateral primary osteoarthritis, left hip: Secondary | ICD-10-CM

## 2015-12-28 ENCOUNTER — Ambulatory Visit
Admission: RE | Admit: 2015-12-28 | Discharge: 2015-12-28 | Disposition: A | Discharge status: Home / Self Care | Payer: Medicare Other | Source: Ambulatory Visit | Attending: Sports Medicine | Admitting: Sports Medicine

## 2015-12-28 DIAGNOSIS — M1612 Unilateral primary osteoarthritis, left hip: Secondary | ICD-10-CM

## 2015-12-28 MED ORDER — IOHEXOL 180 MG/ML  SOLN
1.0000 mL | Freq: Once | INTRAMUSCULAR | Status: AC | PRN
  Administered 2015-12-28: 1 mL via INTRA_ARTICULAR

## 2015-12-28 MED ORDER — METHYLPREDNISOLONE ACETATE 40 MG/ML INJ SUSP (RADIOLOG
120.0000 mg | Freq: Once | INTRAMUSCULAR | Status: AC
  Administered 2015-12-28: 120 mg via INTRA_ARTICULAR

## 2016-01-03 DIAGNOSIS — H25013 Cortical age-related cataract, bilateral: Secondary | ICD-10-CM | POA: Diagnosis not present

## 2016-01-03 DIAGNOSIS — H04123 Dry eye syndrome of bilateral lacrimal glands: Secondary | ICD-10-CM | POA: Diagnosis not present

## 2016-01-03 DIAGNOSIS — H43393 Other vitreous opacities, bilateral: Secondary | ICD-10-CM | POA: Diagnosis not present

## 2016-01-03 DIAGNOSIS — H2513 Age-related nuclear cataract, bilateral: Secondary | ICD-10-CM | POA: Diagnosis not present

## 2016-01-29 DIAGNOSIS — H43393 Other vitreous opacities, bilateral: Secondary | ICD-10-CM | POA: Diagnosis not present

## 2016-01-29 DIAGNOSIS — H2511 Age-related nuclear cataract, right eye: Secondary | ICD-10-CM | POA: Diagnosis not present

## 2016-01-29 DIAGNOSIS — M25552 Pain in left hip: Secondary | ICD-10-CM | POA: Diagnosis not present

## 2016-01-29 DIAGNOSIS — R1030 Lower abdominal pain, unspecified: Secondary | ICD-10-CM | POA: Diagnosis not present

## 2016-01-29 DIAGNOSIS — H2512 Age-related nuclear cataract, left eye: Secondary | ICD-10-CM | POA: Diagnosis not present

## 2016-01-29 DIAGNOSIS — M1612 Unilateral primary osteoarthritis, left hip: Secondary | ICD-10-CM | POA: Diagnosis not present

## 2016-02-13 DIAGNOSIS — L237 Allergic contact dermatitis due to plants, except food: Secondary | ICD-10-CM | POA: Diagnosis not present

## 2016-02-13 DIAGNOSIS — H2511 Age-related nuclear cataract, right eye: Secondary | ICD-10-CM | POA: Diagnosis not present

## 2016-02-14 DIAGNOSIS — M25552 Pain in left hip: Secondary | ICD-10-CM | POA: Diagnosis not present

## 2016-02-20 DIAGNOSIS — R1030 Lower abdominal pain, unspecified: Secondary | ICD-10-CM | POA: Diagnosis not present

## 2016-02-20 DIAGNOSIS — M1612 Unilateral primary osteoarthritis, left hip: Secondary | ICD-10-CM | POA: Diagnosis not present

## 2016-02-20 DIAGNOSIS — M25552 Pain in left hip: Secondary | ICD-10-CM | POA: Diagnosis not present

## 2016-02-27 DIAGNOSIS — H2512 Age-related nuclear cataract, left eye: Secondary | ICD-10-CM | POA: Diagnosis not present

## 2016-02-28 ENCOUNTER — Telehealth: Payer: Self-pay | Admitting: *Deleted

## 2016-02-28 NOTE — Telephone Encounter (Signed)
Requesting surgical clearance:   1. Type of surgery: Left Hip: left THA-DA  2. Surgeon: Rod Can, MD  3. Surgical date: pending  4. Medications that need to be held:                        Special instructions: please clear patient for surgery.

## 2016-03-04 DIAGNOSIS — Z6826 Body mass index (BMI) 26.0-26.9, adult: Secondary | ICD-10-CM | POA: Diagnosis not present

## 2016-03-04 DIAGNOSIS — R351 Nocturia: Secondary | ICD-10-CM | POA: Diagnosis not present

## 2016-03-04 DIAGNOSIS — Z124 Encounter for screening for malignant neoplasm of cervix: Secondary | ICD-10-CM | POA: Diagnosis not present

## 2016-03-04 NOTE — Telephone Encounter (Signed)
Ok for surgery

## 2016-03-06 ENCOUNTER — Encounter: Payer: Self-pay | Admitting: Cardiovascular Disease

## 2016-03-06 ENCOUNTER — Ambulatory Visit (INDEPENDENT_AMBULATORY_CARE_PROVIDER_SITE_OTHER): Payer: Medicare Other | Admitting: Cardiovascular Disease

## 2016-03-06 VITALS — BP 134/84 | HR 64 | Ht 68.5 in | Wt 173.2 lb

## 2016-03-06 DIAGNOSIS — E785 Hyperlipidemia, unspecified: Secondary | ICD-10-CM | POA: Diagnosis not present

## 2016-03-06 DIAGNOSIS — C9110 Chronic lymphocytic leukemia of B-cell type not having achieved remission: Secondary | ICD-10-CM | POA: Diagnosis not present

## 2016-03-06 DIAGNOSIS — R000 Tachycardia, unspecified: Secondary | ICD-10-CM | POA: Diagnosis not present

## 2016-03-06 DIAGNOSIS — Z01818 Encounter for other preprocedural examination: Secondary | ICD-10-CM | POA: Diagnosis not present

## 2016-03-06 NOTE — Patient Instructions (Signed)
Medication Instructions:   Your physician recommends that you continue on your current medications as directed. Please refer to the Current Medication list given to you today.\  If you need a refill on your cardiac medications before your next appointment, please call your pharmacy.  Labwork:  NONE ORDER TODAY    Testing/Procedures:  NONE ORDER TODAY    Follow-Up: Your physician wants you to follow-up in: [place] will receive a reminder letter in the mail two months in advance. If you don't receive a letter, please call our office to schedule the follow-up appointment.     Any Other Special Instructions Will Be Listed Below (If Applicable).

## 2016-03-08 ENCOUNTER — Encounter: Payer: Self-pay | Admitting: Cardiovascular Disease

## 2016-03-08 DIAGNOSIS — Z01818 Encounter for other preprocedural examination: Secondary | ICD-10-CM | POA: Insufficient documentation

## 2016-03-08 NOTE — Progress Notes (Signed)
Patient ID: Colleen Moreno, female   DOB: 1950-11-14, 65 y.o.   MRN: [id number]    HPI: Colleen Moreno is a 65 year old female who is a former patient of Dr. Rollene Fare and established cardiology care with me in 2014.  She presents for one-year follow-up evaluation and is in need for preoperative surgical clearance.  Colleen Moreno developed atypical chest pain while living in [place] and underwent cardiac catheterization while in [place], [place] in 2004 . She was found to have normal coronaries. Following a laparoscopic cholecystectomy in 2004 she developed pulmonary emboli. She was on Coumadin and took this for approximally 6 months. 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 Margot Chimes in September 2012. She has a history of CLL and had been seen by Dr. Chancy Milroy and now sees Dr. Runell Gess.  An echo Doppler study in the September 2012 showed mild concentric LVH with an ejection fraction of 50-55% with grade 1 diastolic dysfunction. She did have trace mitral and tricuspid regurgitation. A nuclear perfusion study at that time showed normal perfusion and function. Ejection fraction was 58%.  The patient also has had issues with occasional GERD for which she has used excellent. She admits to a 50 pound weight loss with her weight being reduced from 212 pounds to approximately 160 pounds. She does admit to some difficulty with fatigability and insomnia. She typically is able to go to bed but he wakes up in approximately 4 hours and then it is difficult for her to go back to sleep but ultimately she does fall back to sleep and wakes up approximately 9. She's retired Engineer, structural.   She had been on Lipitor 80 mg for some time and last year when repeat blood work showed a total cholesterol 147, HDL 46, LDL 79, triglycerides 112, which was excellent, she decided to self discontinued her Lipitor.  She had follow-up blood work by Waunita Schooner, [place] on June 15 2014 which showed her total cholesterol had risen to 322 with a triglycerides of 295 and LDL cholesterol of 221.  Over the past 6 weeks.  She again has started back on her atorvastatin 80 mg.  subsequent blood work in March 2018 after resuming Lipitor was March improved with a total cholesterol of 200, LDL 109, triglycerides are still elevated at 248, HDL was 42.  When I saw her last year she admitted to having an episode of tachycardia where her heart rate increased to 167 bpm.  This lasted for at least an hour and ultimately subsided.  She noticed chest pressure during this episode.  Over the several weeks prior to this she had noticed some increasing shortness of breath with walking, particularly down her driveway.  She was referred for nuclear stress test which was done on 03/02/2015 and was low risk.  There was mild breast attenuation artifact.  Ejection fraction was 53%.  There was no evidence for ischemia.  She has a history of hyperlipidemia but stopped taking her cholesterol medicine for at least 6 months.  When she had follow-up blood work by a cold in January her cholesterol was again elevated and she resumed taking her current dose of Lipitor 80 mg. She will be having follow-up blood work done at her primary care's office.  She is in need for orthopedic surgery with left hip replacement which is to be done by Dr. Lolita Lenz.  She remains active.  She denies chest pain or shortness  of breath.  She presents for evaluation.  Past Medical History  Diagnosis Date  . Clotting disorder ([place]) 2006    blood clot  . 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 Allergy   . Hyperlipidemia   . CLL (chronic lymphoblastic leukemia)   . Breast cancer ([place])     left  . GERD (gastroesophageal reflux disease)     Past Surgical History  Procedure Laterality Date  . Hernia repair    . Knee arthroscopy  2006  . Gallbladder surgery  2003  . Thyroid surgery  1979  . Mastectomy partial /  lumpectomy  06/11/2011    Left  . Cardiac catheterization  2004    right femoral    No Known Allergies  Current Outpatient Prescriptions  Medication Sig Dispense Refill  . atorvastatin (LIPITOR) 80 MG tablet Take 40 mg by mouth daily. Reported on 10/15/2015    . cetirizine (ZYRTEC) 10 MG tablet Take 10 mg by mouth daily.    . lansoprazole (PREVACID) 30 MG capsule Take 30 mg by mouth daily.  3  . NASONEX 50 MCG/ACT nasal spray Inhale 2 sprays into the lungs as needed. Reported on 10/15/2015  3   No current facility-administered medications for this visit.    Socially she was born [place]. She had lived in [place], [place] where she was a Engineer, structural. At that time her husband had worked for [employer] in [place] and was transferred to [place]. She has been [place] since 2004.  Family History  Problem Relation Age of Onset  . Cancer Mother     some type uterus or cervixor ovary   ROS General: Negative; No fevers, chills, or night sweats;  HEENT: Negative; No changes in vision or hearing, sinus congestion, difficulty swallowing Pulmonary: Negative; No cough, wheezing, shortness of breath, hemoptysis Cardiovascular:  See history of present illness GI: issue of GERD; No nausea, vomiting, diarrhea, or abdominal pain GU: Negative; No dysuria, hematuria, or difficulty voiding Musculoskeletal: Negative; no myalgias, joint pain, or weakness Hematologic/Oncology: Negative; no easy bruising, bleeding Endocrine: Negative; no heat/cold intolerance; no diabetes Neuro: Negative; no changes in balance, headaches Skin: Negative; No rashes or skin lesions Psychiatric: Negative; No behavioral problems, depression Sleep: history of some difficulty with sleep maintenance; No snoring, daytime sleepiness, hypersomnolence, bruxism, restless legs, hypnogognic hallucinations, no cataplexy Other comprehensive 14 point system review is negative.   PE BP 134/84 mmHg  Pulse  64  Ht 5' 8.5" (1.74 m)  Wt 173 lb 3.2 oz (78.563 kg)  BMI 25.95 kg/m2  Wt Readings from Last 3 Encounters:  03/06/16 173 lb 3.2 oz (78.563 kg)  10/15/15 181 lb 8 oz (82.328 kg)  03/02/15 174 lb (78.926 kg)   General: Alert, oriented, no distress.  Skin: normal turgor, no rashes HEENT: Normocephalic, atraumatic. Pupils round and reactive; sclera anicteric; Fundi no hemorrhages or exudates. Nose without nasal septal hypertrophy Mouth/Parynx benign; Mallinpatti scale 2/3 Neck: No JVD, no carotid bruits, normal carotid upstroke Lungs: clear to ausculatation and percussion; no wheezing or rales Chest wall: Nontender to palpation Heart: RRR, s1 s2 normal short 1/6 systolic murmur at the left sternal border. No gallop. No clicks. Abdomen: soft, nontender; no hepatosplenomehaly, BS+; abdominal aorta nontender and not dilated by palpation. Back: No CVA tenderness Pulses 2+ Extremities: no clubbinbg cyanosis or edema, Homan's sign negative  Neurologic: grossly nonfocal Psychologic: Normal mood and affect  ECG (independently read by me): Normal sinus rhythm at 64 bpm.  Poor  anterior R-wave progression.  Previously noted T-wave changes.  ECG (independently read by me): Normal sinus rhythm at 69 bpm.  Incomplete right bundle branch block.  T-wave changes II, III, and F V4 through V6.  November 2015 ECG (independently read by me): Normal sinus rhythm.  Incomplete right bundle branch block.  Poor progression.  Left axis deviation.  Nondiagnostic T changes  November 2014 ECG: Normal sinus rhythm at 76 beats per minute. Poor R wave progression anteriorly. Normal intervals.  LABS: BMP Latest Ref Rng 10/15/2015 02/27/2015 10/12/2014  Glucose 70 - 140 mg/dl 90 83 108  BUN 7.0 - 26.0 mg/dL 12.5 13 10.7  Creatinine 0.6 - 1.1 mg/dL 0.9 0.73 0.8  Sodium 136 - 145 mEq/L 146(H) 141 143  Potassium 3.5 - 5.1 mEq/L 4.2 4.5 3.8  Chloride 96 - 112 mEq/L - 106 -  CO2 22 - 29 mEq/L 23 28 21(L)  Calcium 8.4 -  10.4 mg/dL 9.7 9.4 9.2   Hepatic Function Latest Ref Rng 10/15/2015 02/27/2015 10/12/2014  Total Protein 6.4 - 8.3 g/dL 7.2 6.3 6.3(L)  Albumin 3.5 - 5.0 g/dL 4.3 4.5 4.1  AST 5 - 34 U/L '10 12 15  ' ALT 0 - 55 U/L '11 14 19  ' Alk Phosphatase 40 - 150 U/L 90 78 91  Total Bilirubin 0.20 - 1.20 mg/dL 0.41 1.1 1.16   CBC Latest Ref Rng 10/15/2015 02/27/2015 10/12/2014  WBC 3.9 - 10.3 10e3/uL 14.7(H) 16.8(H) 15.8(H)  Hemoglobin 11.6 - 15.9 g/dL 15.4 14.7 14.3  Hematocrit 34.8 - 46.6 % 46.9(H) 44.7 44.5  Platelets 145 - 400 10e3/uL 358 348 339   Lab Results  Component Value Date   MCV 90.1 10/15/2015   MCV 92.0 02/27/2015   MCV 91.9 10/12/2014   No results found for: TSH  Lipid Panel     Component Value Date/Time   CHOL 215* 02/27/2015 0951   TRIG 341* 02/27/2015 0951   HDL 41* 02/27/2015 0951   CHOLHDL 5.2 02/27/2015 0951   VLDL 68* 02/27/2015 0951   LDLCALC 106* 02/27/2015 0951      BNP No results found for: PROBNP  Lipid Panel     Component Value Date/Time   CHOL 215* 02/27/2015 0951     RADIOLOGY: No results found.   ASSESSMENT AND PLAN:  Colleen Moreno is a 65 year old female who has a history of in situ ductal carcinoma of her left breast, as well as CLL.  When I saw her last year she had experienced an episode of tachycardia with heart rates in the 160s associated with mild chest discomfort.  At that time, I added beta blocker therapy with Toprol-XL.  She underwent a nuclear perfusion study which was low risk and did not show any evidence for scar or ischemia, although there was a suggestion of breast attenuation artifact.  She has poor R-wave progression on her ECG.  She apparently had stopped taking her lipid-lowering therapy on her own, but her lipids significantly increased and now she is back on therapy with plans for follow-up blood work by her primary physician.  She has not had any anginal symptoms.  She denies any episodes of recurrent palpitations.  Apparently  she is not taking daily beta blocker therapy.  She is in need for left hip replacement surgery.  I will give her clearance to undergo this.  She tells me she may want to defer this until after summer.  I last that her blood work be forwarded to my office for my  review.  I will see her in one year for reevaluation.    Time spent: 25 minutes  Troy Sine, MD, [facility] 03/08/2016 12:06 PM

## 2016-04-02 ENCOUNTER — Other Ambulatory Visit (HOSPITAL_COMMUNITY): Payer: Self-pay | Admitting: Orthopedic Surgery

## 2016-04-02 ENCOUNTER — Other Ambulatory Visit: Payer: Self-pay | Admitting: Obstetrics and Gynecology

## 2016-04-02 DIAGNOSIS — Z853 Personal history of malignant neoplasm of breast: Secondary | ICD-10-CM

## 2016-04-02 DIAGNOSIS — Z856 Personal history of leukemia: Secondary | ICD-10-CM

## 2016-04-02 DIAGNOSIS — Z86711 Personal history of pulmonary embolism: Secondary | ICD-10-CM

## 2016-04-03 ENCOUNTER — Ambulatory Visit: Payer: Self-pay | Admitting: Orthopedic Surgery

## 2016-04-22 DIAGNOSIS — M1612 Unilateral primary osteoarthritis, left hip: Secondary | ICD-10-CM | POA: Diagnosis not present

## 2016-05-07 DIAGNOSIS — Z23 Encounter for immunization: Secondary | ICD-10-CM | POA: Diagnosis not present

## 2016-05-07 DIAGNOSIS — E782 Mixed hyperlipidemia: Secondary | ICD-10-CM | POA: Diagnosis not present

## 2016-05-13 ENCOUNTER — Other Ambulatory Visit: Payer: Self-pay | Admitting: Physician Assistant

## 2016-05-13 ENCOUNTER — Ambulatory Visit: Payer: Self-pay | Admitting: Orthopedic Surgery

## 2016-05-13 DIAGNOSIS — M1612 Unilateral primary osteoarthritis, left hip: Secondary | ICD-10-CM | POA: Diagnosis not present

## 2016-05-13 NOTE — H&P (Signed)
TOTAL HIP ADMISSION H&P  Patient is admitted for left total hip arthroplasty.  Subjective:  Chief Complaint: left hip pain  HPI: Colleen Moreno, 65 y.o. female, has a history of pain and functional disability in the left hip(s) due to arthritis and patient has failed non-surgical conservative treatments for greater than 12 weeks to include NSAID's and/or analgesics, corticosteriod injections, flexibility and strengthening excercises, use of assistive devices, weight reduction as appropriate and activity modification.  Onset of symptoms was abrupt starting 1 years ago with rapidlly worsening course since that time.The patient noted no past surgery on the left hip(s).  Patient currently rates pain in the left hip at 10 out of 10 with activity. Patient has night pain, worsening of pain with activity and weight bearing, pain that interfers with activities of daily living, pain with passive range of motion and crepitus. Patient has evidence of subchondral cysts, subchondral sclerosis, periarticular osteophytes and joint space narrowing by imaging studies. This condition presents safety issues increasing the risk of falls.   There is no current active infection.  Patient Active Problem List   Diagnosis Date Noted  . Preoperative clearance 03/08/2016  . Tachycardia 02/21/2015  . Exertional shortness of breath 02/21/2015  . Hyperlipidemia 08/01/2014  . Fatigue 08/01/2014  . GERD (gastroesophageal reflux disease) 08/01/2014  . History of pulmonary embolus (PE) 08/14/2013  . Tobacco use 08/14/2013  . Insomnia 08/14/2013  . Breast cancer of lower-outer quadrant of left female breast ([place]) 05/28/2011  . Chronic lymphocytic leukemia ([place]) 05/27/2009   Past Medical History:  Diagnosis Date  . Allergy   . Breast cancer ([place])    left  . 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 CLL (chronic lymphoblastic leukemia)   . Clotting disorder ([place]) 2006   blood clot  . GERD (gastroesophageal  reflux disease)   . Hyperlipidemia     Past Surgical History:  Procedure Laterality Date  . CARDIAC CATHETERIZATION  2004   right femoral  . GALLBLADDER SURGERY  2003  . HERNIA REPAIR    . KNEE ARTHROSCOPY  2006  . MASTECTOMY PARTIAL / LUMPECTOMY  06/11/2011   Left  . THYROID SURGERY  1979     (Not in a hospital admission) No Known Allergies  Social History  Substance Use Topics  . Smoking status: Current Every Day Smoker    Packs/day: 1.00    Years: 1.00    Types: Cigarettes  . Smokeless tobacco: Never Used  . Alcohol use No    Family History  Problem Relation Age of Onset  . Cancer Mother     some type uterus or cervixor ovary     Review of Systems  Constitutional: Negative.   HENT: Negative.   Eyes: Negative.   Respiratory: Negative.   Cardiovascular: Negative.   Gastrointestinal: Negative.   Genitourinary: Negative.   Musculoskeletal: Positive for joint pain.  Skin: Negative.   Neurological: Negative.   Endo/Heme/Allergies: Negative.   Psychiatric/Behavioral: Negative.     Objective:  Physical Exam  Constitutional: She is oriented to person, place, and time. She appears well-developed and well-nourished.  HENT:  Head: Normocephalic and atraumatic.  Eyes: Conjunctivae and EOM are normal. Pupils are equal, round, and reactive to light.  Neck: Normal range of motion. Neck supple.  Cardiovascular: Normal rate, regular rhythm and intact distal pulses.   Respiratory: Effort normal. No respiratory distress.  GI: Soft. Bowel sounds are normal. She exhibits no distension.  Genitourinary:  Genitourinary Comments: deferred  Musculoskeletal:  Left hip: She exhibits decreased range of motion and bony tenderness.  Neurological: She is alert and oriented to person, place, and time. She has normal reflexes.  Skin: Skin is warm and dry.  Psychiatric: She has a normal mood and affect. Her behavior is normal. Judgment and thought content normal.    Vital signs  in last 24 hours: @VSRANGES @  Labs:   Estimated body mass index is 25.95 kg/m as calculated from the following:   Height as of 03/06/16: 5' 8.5" (1.74 m).   Weight as of 03/06/16: 78.6 kg (173 lb 3.2 oz).   Imaging Review Plain radiographs demonstrate severe degenerative joint disease of the left hip(s). The bone quality appears to be adequate for age and reported activity level.  Assessment/Plan:  End stage arthritis, left hip(s)  The patient history, physical examination, clinical judgement of the provider and imaging studies are consistent with end stage degenerative joint disease of the left hip(s) and total hip arthroplasty is deemed medically necessary. The treatment options including medical management, injection therapy, arthroscopy and arthroplasty were discussed at length. The risks and benefits of total hip arthroplasty were presented and reviewed. The risks due to aseptic loosening, infection, stiffness, dislocation/subluxation,  thromboembolic complications and other imponderables were discussed.  The patient acknowledged the explanation, agreed to proceed with the plan and consent was signed. Patient is being admitted for inpatient treatment for surgery, pain control, PT, OT, prophylactic antibiotics, VTE prophylaxis, progressive ambulation and ADL's and discharge planning.The patient is planning to be discharged home with home health services. Is having temporary IVC filter placed. H/o CLL.

## 2016-05-14 ENCOUNTER — Ambulatory Visit (HOSPITAL_COMMUNITY)
Admission: RE | Admit: 2016-05-14 | Discharge: 2016-05-14 | Disposition: A | Discharge status: Home / Self Care | Payer: Medicare Other | Source: Ambulatory Visit | Attending: Orthopedic Surgery | Admitting: Orthopedic Surgery

## 2016-05-14 ENCOUNTER — Encounter (HOSPITAL_COMMUNITY): Payer: Self-pay

## 2016-05-14 ENCOUNTER — Other Ambulatory Visit: Payer: Self-pay | Admitting: Radiology

## 2016-05-14 DIAGNOSIS — D689 Coagulation defect, unspecified: Secondary | ICD-10-CM | POA: Insufficient documentation

## 2016-05-14 DIAGNOSIS — Z87891 Personal history of nicotine dependence: Secondary | ICD-10-CM | POA: Diagnosis not present

## 2016-05-14 DIAGNOSIS — Z853 Personal history of malignant neoplasm of breast: Secondary | ICD-10-CM | POA: Diagnosis not present

## 2016-05-14 DIAGNOSIS — Z856 Personal history of leukemia: Secondary | ICD-10-CM

## 2016-05-14 DIAGNOSIS — Z408 Encounter for other prophylactic surgery: Secondary | ICD-10-CM | POA: Diagnosis not present

## 2016-05-14 DIAGNOSIS — Z86718 Personal history of other venous thrombosis and embolism: Secondary | ICD-10-CM | POA: Diagnosis not present

## 2016-05-14 DIAGNOSIS — Z86711 Personal history of pulmonary embolism: Secondary | ICD-10-CM | POA: Diagnosis not present

## 2016-05-14 DIAGNOSIS — C9110 Chronic lymphocytic leukemia of B-cell type not having achieved remission: Secondary | ICD-10-CM | POA: Insufficient documentation

## 2016-05-14 DIAGNOSIS — E785 Hyperlipidemia, unspecified: Secondary | ICD-10-CM | POA: Insufficient documentation

## 2016-05-14 DIAGNOSIS — K219 Gastro-esophageal reflux disease without esophagitis: Secondary | ICD-10-CM | POA: Insufficient documentation

## 2016-05-14 HISTORY — PX: IR GENERIC HISTORICAL: IMG1180011

## 2016-05-14 HISTORY — PX: IVC FILTER PLACEMENT (ARMC HX): HXRAD1551

## 2016-05-14 LAB — BASIC METABOLIC PANEL
ANION GAP: 8 (ref 5–15)
BUN: 16 mg/dL (ref 6–20)
CHLORIDE: 109 mmol/L (ref 101–111)
CO2: 22 mmol/L (ref 22–32)
Calcium: 9.3 mg/dL (ref 8.9–10.3)
Creatinine, Ser: 0.75 mg/dL (ref 0.44–1.00)
GFR calc Af Amer: >60 mL/min (ref >60)
GLUCOSE: 96 mg/dL (ref 65–99)
POTASSIUM: 3.8 mmol/L (ref 3.5–5.1)
SODIUM: 139 mmol/L (ref 135–145)

## 2016-05-14 LAB — CBC
HCT: 42.6 % (ref 36.0–46.0)
HEMOGLOBIN: 14.2 g/dL (ref 12.0–15.0)
MCH: 29.9 pg (ref 26.0–34.0)
MCHC: 33.3 g/dL (ref 30.0–36.0)
MCV: 89.7 fL (ref 78.0–100.0)
Platelets: 373 10*3/uL (ref 150–400)
RBC: 4.75 MIL/uL (ref 3.87–5.11)
RDW: 13.5 % (ref 11.5–15.5)
WBC: 12.6 10*3/uL — AB (ref 4.0–10.5)

## 2016-05-14 LAB — PROTIME-INR
INR: 0.93
Prothrombin Time: 12.4 seconds (ref 11.4–15.2)

## 2016-05-14 LAB — APTT: APTT: 26 s (ref 24–36)

## 2016-05-14 MED ORDER — FENTANYL CITRATE (PF) 100 MCG/2ML IJ SOLN
50.0000 ug | Freq: Once | INTRAMUSCULAR | Status: AC
  Administered 2016-05-14: 50 ug via INTRAVENOUS

## 2016-05-14 MED ORDER — IOPAMIDOL (ISOVUE-300) INJECTION 61%
30.0000 mL | Freq: Once | INTRAVENOUS | Status: AC | PRN
  Administered 2016-05-14: 30 mL via INTRAVENOUS

## 2016-05-14 MED ORDER — FENTANYL CITRATE (PF) 100 MCG/2ML IJ SOLN
INTRAMUSCULAR | Status: AC
  Filled 2016-05-14: qty 6

## 2016-05-14 MED ORDER — HEPARIN SOD (PORK) LOCK FLUSH 100 UNIT/ML IV SOLN
INTRAVENOUS | Status: AC
  Filled 2016-05-14: qty 5

## 2016-05-14 MED ORDER — SODIUM CHLORIDE 0.9 % IV SOLN
INTRAVENOUS | Status: DC
  Administered 2016-05-14: 13:00:00 via INTRAVENOUS

## 2016-05-14 MED ORDER — MIDAZOLAM HCL 2 MG/2ML IJ SOLN
INTRAMUSCULAR | Status: AC | PRN
  Administered 2016-05-14: 1 mg via INTRAVENOUS

## 2016-05-14 MED ORDER — MIDAZOLAM HCL 2 MG/2ML IJ SOLN
INTRAMUSCULAR | Status: AC
  Filled 2016-05-14: qty 6

## 2016-05-14 MED ORDER — FENTANYL CITRATE (PF) 100 MCG/2ML IJ SOLN
INTRAMUSCULAR | Status: AC | PRN
  Administered 2016-05-14: 50 ug via INTRAVENOUS

## 2016-05-14 MED ORDER — FENTANYL CITRATE (PF) 100 MCG/2ML IJ SOLN
INTRAMUSCULAR | Status: AC
  Filled 2016-05-14: qty 2

## 2016-05-14 MED ORDER — LIDOCAINE HCL 1 % IJ SOLN
INTRAMUSCULAR | Status: AC
  Filled 2016-05-14: qty 20

## 2016-05-14 MED ORDER — HYDROCODONE-ACETAMINOPHEN 5-325 MG PO TABS: 1.0000 | ORAL_TABLET | ORAL | Status: DC | PRN

## 2016-05-14 NOTE — Sedation Documentation (Signed)
Attempted to push  Versed IV, would not flush.  Wasted 1mg  .  Dr. Kathlene Cote will push meds through Inf. V

## 2016-05-14 NOTE — Discharge Instructions (Signed)
Inferior Vena Cava Filter Insertion, Care After  Refer to this sheet in the next few weeks. These instructions provide you with information on caring for yourself after your procedure. Your health care provider may also give you more specific instructions. Your treatment has been planned according to current medical practices, but problems sometimes occur. Call your health care provider if you have any problems or questions after your procedure.  WHAT TO EXPECT AFTER THE PROCEDURE  After your procedure, it is typical to have the following:   Mild pain in the area where the filter was inserted.   Mild bruising in the area where the filter was inserted.  HOME CARE INSTRUCTIONS   You will be given medicine to control pain. Only take over-the-counter or prescription medicines for pain, fever, or discomfort as directed by your health care provider.   A bandage (dressing) has been placed over the insertion site. Follow your health care provider's instructions on how to care for it.   Keep the insertion site clean and dry.   Do not soak in a bath tub or pool until the filter insertion site has healed.   Do not drive if you are taking narcotic pain medicines. Follow your health care provider's instructions about driving.   Do not return to work or school until your health care provider says it is okay.    Keep all follow-up appointments.   SEEK IMMEDIATE MEDICAL CARE IF:   You develop swelling and discoloration or pain in the legs.   Your legs become pale and cold or blue.   You develop shortness of breath, feel faint, or pass out.   You develop chest pain, a cough, or difficulty breathing.   You cough up blood.   You develop a rash or feel you are having problems that may be a side effect of medicines.   You develop weakness, difficulty moving your arms or legs, or balance problems.   You develop problems with speech or vision.     This information is not intended to replace advice given to you by your  health care provider. Make sure you discuss any questions you have with your health care provider.     Document Released: 06/22/2013 Document Reviewed: 06/22/2013  Elsevier Interactive Patient Education 2016 [employer]

## 2016-05-14 NOTE — Consult Note (Signed)
Chief Complaint: Patient was seen in consultation today for IVC filter placement  Referring Physician(s): [place]  Supervising Physician: Aletta Edouard  Patient Status: Outpatient  History of Present Illness: Colleen Moreno is a 65 y.o. female with remote history of left breast cancer, CLL as well as PE in 2003. She is scheduled for left total hip arthroplasty on 05/29/16 and presents today for preop IVC filter placement.  Past Medical History:  Diagnosis Date  . Allergy   . Breast cancer ([place])    left  . 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 CLL (chronic lymphoblastic leukemia)   . Clotting disorder ([place]) 2006   blood clot  . GERD (gastroesophageal reflux disease)   . Hyperlipidemia     Past Surgical History:  Procedure Laterality Date  . CARDIAC CATHETERIZATION  2004   right femoral  . GALLBLADDER SURGERY  2003  . HERNIA REPAIR    . KNEE ARTHROSCOPY  2006  . MASTECTOMY PARTIAL / LUMPECTOMY  06/11/2011   Left  . THYROID SURGERY  1979    Allergies: Review of patient's allergies indicates no known allergies.  Medications: Prior to Admission medications   Medication Sig Start Date End Date Taking? Authorizing Provider  atorvastatin (LIPITOR) 80 MG tablet Take 40 mg by mouth daily. Reported on 10/15/2015   Yes Historical Provider, MD  cetirizine (ZYRTEC) 10 MG tablet Take 10 mg by mouth daily.   Yes Historical Provider, MD  lansoprazole (PREVACID) 30 MG capsule Take 30 mg by mouth daily. 11/27/14  Yes Historical Provider, MD  NASONEX 50 MCG/ACT nasal spray Inhale 2 sprays into the lungs as needed. Reported on 10/15/2015 11/27/14  Yes Historical Provider, MD     Family History  Problem Relation Age of Onset  . Cancer Mother     some type uterus or cervixor ovary    Social History   Social History  . Marital status: Married    Spouse name: N/A  . Number of children: N/A  . Years of education: N/A   Social History Main Topics  .  Smoking status: Former Smoker    Packs/day: 1.00    Years: 1.00    Types: Cigarettes    Quit date: 05/15/2015  . Smokeless tobacco: Never Used  . Alcohol use No  . Drug use: No  . Sexual activity: Yes   Other Topics Concern  . None   Social History Narrative  . None      Review of Systems currently denies fever, headache, chest pain, dyspnea, productive cough, abdominal pain, nausea, vomiting or abnormal bleeding. She does have intermittent low back/left hip pain.  Vital Signs: BP 134/82   Pulse 70   Temp 98.1 F (36.7 C) (Oral)   Resp 16   SpO2 98%   Physical Exam patient awake, alert. Chest clear to auscultation bilaterally. Heart with regular rate and rhythm. Abdomen soft, positive bowel sounds, nontender. Lower extremities with no edema.  Mallampati Score:     Imaging: No results found.  Labs:  CBC:  Recent Labs  10/15/15 1301 05/14/16 1254  WBC 14.7* 12.6*  HGB 15.4 14.2  HCT 46.9* 42.6  PLT 358 373    COAGS:  Recent Labs  05/14/16 1254  INR 0.93  APTT 26    BMP:  Recent Labs  10/15/15 1301 05/14/16 1254  NA 146* 139  K 4.2 3.8  CL  --  109  CO2 23 22  GLUCOSE 90 96  BUN 12.5  16  CALCIUM 9.7 9.3  CREATININE 0.9 0.75  GFRNONAA  --  >60  GFRAA  --  >60    LIVER FUNCTION TESTS:  Recent Labs  10/15/15 1301  BILITOT 0.41  AST 10  ALT 11  ALKPHOS 90  PROT 7.2  ALBUMIN 4.3    TUMOR MARKERS: No results for input(s): AFPTM, CEA, CA199, CHROMGRNA in the last 8760 hours.  Assessment and Plan:  65 y.o. female with remote history of left breast cancer, CLL as well as PE in 2003. She is scheduled for left total hip arthroplasty on 05/29/16 and presents today for preop IVC filter placement.Risks and benefits discussed with the patient/husband including, but not limited to bleeding, infection, contrast induced renal failure, filter fracture or migration which can lead to emergency surgery or even death, strut penetration with damage  or irritation to adjacent structures and caval thrombosis.All of the patient's questions were answered, patient is agreeable to proceed. Consent signed and in chart.     Thank you for this interesting consult.  I greatly enjoyed meeting Colleen Moreno and look forward to participating in their care.  A copy of this report was sent to the requesting provider on this date.  Electronically Signed: D. Rowe Robert 05/14/2016, 1:52 PM   I spent a total of 25 minutes  in face to face in clinical consultation, greater than 50% of which was counseling/coordinating care for IVC filter placement

## 2016-05-14 NOTE — Procedures (Signed)
Interventional Radiology Procedure Note  Procedure:  IVC filter placement  Complications:  None  Estimated Blood Loss: < 10 mL  Findings:  Via right IJ access, IVC venogram shows normally patent IVC. Bard Hiller IVC filter placed in infrarenal IVC.  Venetia Night. Kathlene Cote, M.D Pager:  [phone]

## 2016-05-20 ENCOUNTER — Encounter (HOSPITAL_COMMUNITY): Payer: Self-pay

## 2016-05-20 ENCOUNTER — Encounter (HOSPITAL_COMMUNITY)
Admission: RE | Admit: 2016-05-20 | Discharge: 2016-05-20 | Disposition: A | Discharge status: Home / Self Care | Payer: Medicare Other | Source: Ambulatory Visit | Attending: Orthopedic Surgery | Admitting: Orthopedic Surgery

## 2016-05-20 DIAGNOSIS — Z01812 Encounter for preprocedural laboratory examination: Secondary | ICD-10-CM | POA: Insufficient documentation

## 2016-05-20 DIAGNOSIS — E785 Hyperlipidemia, unspecified: Secondary | ICD-10-CM | POA: Insufficient documentation

## 2016-05-20 DIAGNOSIS — D689 Coagulation defect, unspecified: Secondary | ICD-10-CM | POA: Insufficient documentation

## 2016-05-20 DIAGNOSIS — K219 Gastro-esophageal reflux disease without esophagitis: Secondary | ICD-10-CM | POA: Diagnosis not present

## 2016-05-20 HISTORY — DX: Pneumonia, unspecified organism: J18.9

## 2016-05-20 HISTORY — DX: Unspecified osteoarthritis, unspecified site: M19.90

## 2016-05-20 LAB — ABO/RH: ABO/RH(D): B POS

## 2016-05-20 LAB — CBC
HCT: 40.2 % (ref 36.0–46.0)
HEMOGLOBIN: 13.6 g/dL (ref 12.0–15.0)
MCH: 30.1 pg (ref 26.0–34.0)
MCHC: 33.8 g/dL (ref 30.0–36.0)
MCV: 88.9 fL (ref 78.0–100.0)
Platelets: 340 10*3/uL (ref 150–400)
RBC: 4.52 MIL/uL (ref 3.87–5.11)
RDW: 13.5 % (ref 11.5–15.5)
WBC: 14 10*3/uL — AB (ref 4.0–10.5)

## 2016-05-20 LAB — TYPE AND SCREEN
ABO/RH(D): B POS
ANTIBODY SCREEN: NEGATIVE

## 2016-05-20 LAB — SURGICAL PCR SCREEN
MRSA, PCR: NEGATIVE
STAPHYLOCOCCUS AUREUS: NEGATIVE

## 2016-05-20 NOTE — Patient Instructions (Signed)
Colleen Moreno  05/20/2016   Your procedure is scheduled on: 05/29/16  Report to [facility] Main  Entrance take [place]  elevators to 3rd floor to  [place] at 1045  AM.  Call this number if you have problems the morning of surgery [phone]   Remember: ONLY 1 PERSON MAY GO WITH YOU TO SHORT STAY TO GET  READY MORNING OF [place].  Do not eat food or drink liquids :After Midnight.     Take these medicines the morning of surgery with A SIP OF WATER: Prevacid, Venlafaxin May use Nasonex                                You may not have any metal on your body including hair pins and              piercings  Do not wear jewelry, make-up, lotions, powders or perfumes, deodorant             Do not wear nail polish.  Do not shave  48 hours prior to surgery.              Men may shave face and neck.   Do not bring valuables to the hospital. [place].  Contacts, dentures or bridgework may not be worn into surgery.  Leave suitcase in the car. After surgery it may be brought to your room.            [facility] - Preparing for Surgery Before surgery, you can play an important role.  Because skin is not sterile, your skin needs to be as free of germs as possible.  You can reduce the number of germs on your skin by washing with CHG (chlorahexidine gluconate) soap before surgery.  CHG is an antiseptic cleaner which kills germs and bonds with the skin to continue killing germs even after washing. Please DO NOT use if you have an allergy to CHG or antibacterial soaps.  If your skin becomes reddened/irritated stop using the CHG and inform your nurse when you arrive at Short Stay. Do not shave (including legs and underarms) for at least 48 hours prior to the first CHG shower.  You may shave your face/neck. Please follow these instructions carefully:  1.  Shower with CHG Soap the night before surgery and the  morning  of Surgery.  2.  If you choose to wash your hair, wash your hair first as usual with your  normal  shampoo.  3.  After you shampoo, rinse your hair and body thoroughly to remove the  shampoo.                           4.  Use CHG as you would any other liquid soap.  You can apply chg directly  to the skin and wash                       Gently with a scrungie or clean washcloth.  5.  Apply the CHG Soap to your body ONLY FROM THE NECK DOWN.   Do not use on face/ open  Wound or open sores. Avoid contact with eyes, ears mouth and genitals (private parts).                       Wash face,  Genitals (private parts) with your normal soap.             6.  Wash thoroughly, paying special attention to the area where your surgery  will be performed.  7.  Thoroughly rinse your body with warm water from the neck down.  8.  DO NOT shower/wash with your normal soap after using and rinsing off  the CHG Soap.                9.  Pat yourself dry with a clean towel.            10.  Wear clean pajamas.            11.  Place clean sheets on your bed the night of your first shower and do not  sleep with pets. Day of Surgery : Do not apply any lotions/deodorants the morning of surgery.  Please wear clean clothes to the hospital/surgery center.  FAILURE TO FOLLOW THESE INSTRUCTIONS MAY RESULT IN THE CANCELLATION OF YOUR SURGERY PATIENT SIGNATURE_________________________________  NURSE SIGNATURE__________________________________  ________________________________________________________________________   Adam Phenix  An incentive spirometer is a tool that can help keep your lungs clear and active. This tool measures how well you are filling your lungs with each breath. Taking long deep breaths may help reverse or decrease the chance of developing breathing (pulmonary) problems (especially infection) following:  A long period of time when you are unable to move or be  active. BEFORE THE PROCEDURE   If the spirometer includes an indicator to show your best effort, your nurse or respiratory therapist will set it to a desired goal.  If possible, sit up straight or lean slightly forward. Try not to slouch.  Hold the incentive spirometer in an upright position. INSTRUCTIONS FOR USE  1. Sit on the edge of your bed if possible, or sit up as far as you can in bed or on a chair. 2. Hold the incentive spirometer in an upright position. 3. Breathe out normally. 4. Place the mouthpiece in your mouth and seal your lips tightly around it. 5. Breathe in slowly and as deeply as possible, raising the piston or the ball toward the top of the column. 6. Hold your breath for 3-5 seconds or for as long as possible. Allow the piston or ball to fall to the bottom of the column. 7. Remove the mouthpiece from your mouth and breathe out normally. 8. Rest for a few seconds and repeat Steps 1 through 7 at least 10 times every 1-2 hours when you are awake. Take your time and take a few normal breaths between deep breaths. 9. The spirometer may include an indicator to show your best effort. Use the indicator as a goal to work toward during each repetition. 10. After each set of 10 deep breaths, practice coughing to be sure your lungs are clear. If you have an incision (the cut made at the time of surgery), support your incision when coughing by placing a pillow or rolled up towels firmly against it. Once you are able to get out of bed, walk around indoors and cough well. You may stop using the incentive spirometer when instructed by your caregiver.  RISKS AND COMPLICATIONS  Take your time so you do not get  dizzy or light-headed.  If you are in pain, you may need to take or ask for pain medication before doing incentive spirometry. It is harder to take a deep breath if you are having pain. AFTER USE  Rest and breathe slowly and easily.  It can be helpful to keep track of a log of  your progress. Your caregiver can provide you with a simple table to help with this. If you are using the spirometer at home, follow these instructions: [place] IF:   You are having difficultly using the spirometer.  You have trouble using the spirometer as often as instructed.  Your pain medication is not giving enough relief while using the spirometer.  You develop fever of 100.5 F (38.1 C) or higher. SEEK IMMEDIATE MEDICAL CARE IF:   You cough up bloody sputum that had not been present before.  You develop fever of 102 F (38.9 C) or greater.  You develop worsening pain at or near the incision site. MAKE SURE YOU:   Understand these instructions.  Will watch your condition.  Will get help right away if you are not doing well or get worse. Document Released: 01/12/2007 Document Revised: 11/24/2011 Document Reviewed: 03/15/2007 [facility] Patient Information 2014 [place], [place].   ________________________________________________________________________

## 2016-05-20 NOTE — Progress Notes (Signed)
Ov Dr Claiborne Billings with ekg 02/29/16 epic Bmp 05/14/16 epic, with pt/inr

## 2016-05-26 ENCOUNTER — Ambulatory Visit
Admission: RE | Admit: 2016-05-26 | Discharge: 2016-05-26 | Disposition: A | Discharge status: Home / Self Care | Payer: Medicare Other | Source: Ambulatory Visit | Attending: Obstetrics and Gynecology | Admitting: Obstetrics and Gynecology

## 2016-05-26 DIAGNOSIS — R922 Inconclusive mammogram: Secondary | ICD-10-CM | POA: Diagnosis not present

## 2016-05-26 DIAGNOSIS — Z853 Personal history of malignant neoplasm of breast: Secondary | ICD-10-CM

## 2016-05-29 ENCOUNTER — Inpatient Hospital Stay (HOSPITAL_COMMUNITY): Payer: Medicare Other

## 2016-05-29 ENCOUNTER — Inpatient Hospital Stay (HOSPITAL_COMMUNITY)
Admission: RE | Admit: 2016-05-29 | Discharge: 2016-05-30 | DRG: 470 | Disposition: A | Discharge status: Home / Self Care | Payer: Medicare Other | Source: Ambulatory Visit | Attending: Orthopedic Surgery | Admitting: Orthopedic Surgery

## 2016-05-29 ENCOUNTER — Inpatient Hospital Stay (HOSPITAL_COMMUNITY): Payer: Medicare Other | Admitting: Anesthesiology

## 2016-05-29 ENCOUNTER — Encounter (HOSPITAL_COMMUNITY): Payer: Self-pay | Admitting: Anesthesiology

## 2016-05-29 ENCOUNTER — Encounter (HOSPITAL_COMMUNITY)
Admission: RE | Disposition: A | Discharge status: Home / Self Care | Payer: Self-pay | Source: Ambulatory Visit | Attending: Orthopedic Surgery

## 2016-05-29 DIAGNOSIS — K219 Gastro-esophageal reflux disease without esophagitis: Secondary | ICD-10-CM | POA: Diagnosis present

## 2016-05-29 DIAGNOSIS — Z86718 Personal history of other venous thrombosis and embolism: Secondary | ICD-10-CM | POA: Diagnosis not present

## 2016-05-29 DIAGNOSIS — E785 Hyperlipidemia, unspecified: Secondary | ICD-10-CM | POA: Diagnosis present

## 2016-05-29 DIAGNOSIS — Z09 Encounter for follow-up examination after completed treatment for conditions other than malignant neoplasm: Secondary | ICD-10-CM

## 2016-05-29 DIAGNOSIS — F17210 Nicotine dependence, cigarettes, uncomplicated: Secondary | ICD-10-CM | POA: Diagnosis present

## 2016-05-29 DIAGNOSIS — R52 Pain, unspecified: Secondary | ICD-10-CM

## 2016-05-29 DIAGNOSIS — Z853 Personal history of malignant neoplasm of breast: Secondary | ICD-10-CM

## 2016-05-29 DIAGNOSIS — M1612 Unilateral primary osteoarthritis, left hip: Principal | ICD-10-CM | POA: Diagnosis present

## 2016-05-29 DIAGNOSIS — M25559 Pain in unspecified hip: Secondary | ICD-10-CM

## 2016-05-29 DIAGNOSIS — Z96642 Presence of left artificial hip joint: Secondary | ICD-10-CM | POA: Diagnosis not present

## 2016-05-29 DIAGNOSIS — Z86711 Personal history of pulmonary embolism: Secondary | ICD-10-CM

## 2016-05-29 DIAGNOSIS — M25552 Pain in left hip: Secondary | ICD-10-CM | POA: Diagnosis not present

## 2016-05-29 DIAGNOSIS — Z471 Aftercare following joint replacement surgery: Secondary | ICD-10-CM | POA: Diagnosis not present

## 2016-05-29 DIAGNOSIS — Z856 Personal history of leukemia: Secondary | ICD-10-CM | POA: Diagnosis not present

## 2016-05-29 HISTORY — PX: TOTAL HIP ARTHROPLASTY: SHX124

## 2016-05-29 SURGERY — ARTHROPLASTY, HIP, TOTAL, ANTERIOR APPROACH
Anesthesia: Spinal | Site: Hip | Laterality: Left

## 2016-05-29 MED ORDER — FLUTICASONE PROPIONATE 50 MCG/ACT NA SUSP
2.0000 | Freq: Every day | NASAL | Status: DC
  Filled 2016-05-29: qty 16

## 2016-05-29 MED ORDER — ISOPROPYL ALCOHOL 70 % SOLN
Status: AC
  Filled 2016-05-29: qty 480

## 2016-05-29 MED ORDER — ACETAMINOPHEN 10 MG/ML IV SOLN
1000.0000 mg | INTRAVENOUS | Status: AC
  Administered 2016-05-29: 1000 mg via INTRAVENOUS

## 2016-05-29 MED ORDER — PROPOFOL 10 MG/ML IV BOLUS
INTRAVENOUS | Status: AC
  Filled 2016-05-29: qty 20

## 2016-05-29 MED ORDER — ONDANSETRON HCL 4 MG/2ML IJ SOLN
INTRAMUSCULAR | Status: AC
  Filled 2016-05-29: qty 2

## 2016-05-29 MED ORDER — APIXABAN 2.5 MG PO TABS
2.5000 mg | ORAL_TABLET | Freq: Two times a day (BID) | ORAL | Status: DC
  Administered 2016-05-30: 2.5 mg via ORAL
  Filled 2016-05-29: qty 1

## 2016-05-29 MED ORDER — ACETAMINOPHEN 10 MG/ML IV SOLN
INTRAVENOUS | Status: AC
  Filled 2016-05-29: qty 100

## 2016-05-29 MED ORDER — FENTANYL CITRATE (PF) 100 MCG/2ML IJ SOLN
INTRAMUSCULAR | Status: DC | PRN
  Administered 2016-05-29: 100 ug via INTRAVENOUS

## 2016-05-29 MED ORDER — PHENOL 1.4 % MT LIQD
1.0000 | OROMUCOSAL | Status: DC | PRN
  Filled 2016-05-29: qty 177

## 2016-05-29 MED ORDER — SORBITOL 70 % SOLN
30.0000 mL | Freq: Every day | Status: DC | PRN
  Filled 2016-05-29: qty 30

## 2016-05-29 MED ORDER — TRANEXAMIC ACID 1000 MG/10ML IV SOLN
2000.0000 mg | INTRAVENOUS | Status: DC
  Filled 2016-05-29: qty 20

## 2016-05-29 MED ORDER — SODIUM CHLORIDE 0.9 % IJ SOLN
INTRAMUSCULAR | Status: AC
  Filled 2016-05-29: qty 50

## 2016-05-29 MED ORDER — FENTANYL CITRATE (PF) 100 MCG/2ML IJ SOLN
INTRAMUSCULAR | Status: AC
  Filled 2016-05-29: qty 2

## 2016-05-29 MED ORDER — PROPOFOL 10 MG/ML IV BOLUS
INTRAVENOUS | Status: AC
Start: 2016-05-29 — End: 2016-05-29
  Filled 2016-05-29: qty 20

## 2016-05-29 MED ORDER — CEFAZOLIN SODIUM-DEXTROSE 2-4 GM/100ML-% IV SOLN
INTRAVENOUS | Status: AC
  Filled 2016-05-29: qty 100

## 2016-05-29 MED ORDER — HYDROCODONE-ACETAMINOPHEN 5-325 MG PO TABS
1.0000 | ORAL_TABLET | ORAL | Status: DC | PRN
  Administered 2016-05-29: 2 via ORAL
  Administered 2016-05-29 (×2): 1 via ORAL
  Administered 2016-05-30 (×3): 2 via ORAL
  Filled 2016-05-29: qty 2
  Filled 2016-05-29: qty 1
  Filled 2016-05-29 (×3): qty 2
  Filled 2016-05-29: qty 1

## 2016-05-29 MED ORDER — BUPIVACAINE HCL (PF) 0.5 % IJ SOLN
INTRAMUSCULAR | Status: AC
  Filled 2016-05-29: qty 30

## 2016-05-29 MED ORDER — ACETAMINOPHEN 325 MG PO TABS: 650.0000 mg | ORAL_TABLET | Freq: Four times a day (QID) | ORAL | Status: DC | PRN

## 2016-05-29 MED ORDER — MIDAZOLAM HCL 2 MG/2ML IJ SOLN
INTRAMUSCULAR | Status: AC
  Filled 2016-05-29: qty 2

## 2016-05-29 MED ORDER — SODIUM CHLORIDE 0.9 % IJ SOLN
INTRAMUSCULAR | Status: DC | PRN
  Administered 2016-05-29: 30 mL

## 2016-05-29 MED ORDER — SENNA 8.6 MG PO TABS
2.0000 | ORAL_TABLET | Freq: Every day | ORAL | Status: DC
  Administered 2016-05-29: 17.2 mg via ORAL
  Filled 2016-05-29: qty 2

## 2016-05-29 MED ORDER — HYDROMORPHONE HCL 1 MG/ML IJ SOLN
INTRAMUSCULAR | Status: AC
  Administered 2016-05-29: 0.5 mg via INTRAVENOUS
  Filled 2016-05-29: qty 1

## 2016-05-29 MED ORDER — HYDROGEN PEROXIDE 3 % EX SOLN
CUTANEOUS | Status: AC
  Filled 2016-05-29: qty 473

## 2016-05-29 MED ORDER — ISOPROPYL ALCOHOL 70 % SOLN
Status: DC | PRN
  Administered 2016-05-29: 1 via TOPICAL

## 2016-05-29 MED ORDER — ONDANSETRON HCL 4 MG/2ML IJ SOLN: 4.0000 mg | Freq: Four times a day (QID) | INTRAMUSCULAR | Status: DC | PRN

## 2016-05-29 MED ORDER — ONDANSETRON HCL 4 MG PO TABS
4.0000 mg | ORAL_TABLET | Freq: Four times a day (QID) | ORAL | Status: DC | PRN
  Administered 2016-05-30 (×2): 4 mg via ORAL
  Filled 2016-05-29 (×2): qty 1

## 2016-05-29 MED ORDER — 0.9 % SODIUM CHLORIDE (POUR BTL) OPTIME
TOPICAL | Status: DC | PRN
  Administered 2016-05-29: 1000 mL

## 2016-05-29 MED ORDER — KETOROLAC TROMETHAMINE 30 MG/ML IJ SOLN
INTRAMUSCULAR | Status: DC | PRN
  Administered 2016-05-29: 30 mg via INTRA_ARTICULAR

## 2016-05-29 MED ORDER — LACTATED RINGERS IV SOLN
INTRAVENOUS | Status: DC
  Administered 2016-05-29: 14:00:00 via INTRAVENOUS
  Administered 2016-05-29: 1000 mL via INTRAVENOUS

## 2016-05-29 MED ORDER — PHENYLEPHRINE HCL 10 MG/ML IJ SOLN
INTRAMUSCULAR | Status: AC
  Filled 2016-05-29: qty 1

## 2016-05-29 MED ORDER — VENLAFAXINE HCL 37.5 MG PO TABS: 37.5000 mg | ORAL_TABLET | Freq: Every day | ORAL | Status: DC

## 2016-05-29 MED ORDER — POVIDONE-IODINE 10 % EX SWAB: 2.0000 "application " | Freq: Once | CUTANEOUS | Status: DC

## 2016-05-29 MED ORDER — MIDAZOLAM HCL 5 MG/5ML IJ SOLN
INTRAMUSCULAR | Status: DC | PRN
  Administered 2016-05-29: 2 mg via INTRAVENOUS

## 2016-05-29 MED ORDER — BUPIVACAINE HCL (PF) 0.5 % IJ SOLN
INTRAMUSCULAR | Status: DC | PRN
  Administered 2016-05-29: 15 mg via INTRATHECAL

## 2016-05-29 MED ORDER — CEFAZOLIN SODIUM-DEXTROSE 2-4 GM/100ML-% IV SOLN
2.0000 g | INTRAVENOUS | Status: AC
  Administered 2016-05-29 (×2): 2 g via INTRAVENOUS

## 2016-05-29 MED ORDER — SODIUM CHLORIDE 0.9 % IV SOLN
INTRAVENOUS | Status: DC
  Administered 2016-05-29: 150 mL/h via INTRAVENOUS

## 2016-05-29 MED ORDER — FLEET ENEMA 7-19 GM/118ML RE ENEM: 1.0000 | ENEMA | Freq: Once | RECTAL | Status: DC | PRN

## 2016-05-29 MED ORDER — PROPOFOL 500 MG/50ML IV EMUL
INTRAVENOUS | Status: DC | PRN
  Administered 2016-05-29: 100 ug/kg/min via INTRAVENOUS

## 2016-05-29 MED ORDER — ATORVASTATIN CALCIUM 20 MG PO TABS
40.0000 mg | ORAL_TABLET | Freq: Every day | ORAL | Status: DC
  Administered 2016-05-29 – 2016-05-30 (×2): 40 mg via ORAL
  Filled 2016-05-29 (×2): qty 2

## 2016-05-29 MED ORDER — BUPIVACAINE HCL (PF) 0.25 % IJ SOLN
INTRAMUSCULAR | Status: DC | PRN
  Administered 2016-05-29: 30 mL

## 2016-05-29 MED ORDER — DOCUSATE SODIUM 100 MG PO CAPS
100.0000 mg | ORAL_CAPSULE | Freq: Two times a day (BID) | ORAL | Status: DC
  Administered 2016-05-29 – 2016-05-30 (×2): 100 mg via ORAL
  Filled 2016-05-29 (×2): qty 1

## 2016-05-29 MED ORDER — PROMETHAZINE HCL 25 MG/ML IJ SOLN: 6.2500 mg | INTRAMUSCULAR | Status: DC | PRN

## 2016-05-29 MED ORDER — SODIUM CHLORIDE 0.9 % IV SOLN: INTRAVENOUS | Status: DC

## 2016-05-29 MED ORDER — SODIUM CHLORIDE 0.9 % IV SOLN
INTRAVENOUS | Status: DC | PRN
  Administered 2016-05-29: 30 ug/min via INTRAVENOUS

## 2016-05-29 MED ORDER — HYDROMORPHONE HCL 1 MG/ML IJ SOLN
0.2500 mg | INTRAMUSCULAR | Status: DC | PRN
  Administered 2016-05-29: 0.5 mg via INTRAVENOUS

## 2016-05-29 MED ORDER — PANTOPRAZOLE SODIUM 40 MG PO TBEC
40.0000 mg | DELAYED_RELEASE_TABLET | Freq: Every day | ORAL | Status: DC
Start: 2016-05-29 — End: 2016-05-30
  Administered 2016-05-29 – 2016-05-30 (×2): 40 mg via ORAL
  Filled 2016-05-29 (×2): qty 1

## 2016-05-29 MED ORDER — BUPIVACAINE HCL (PF) 0.25 % IJ SOLN
INTRAMUSCULAR | Status: AC
  Filled 2016-05-29: qty 30

## 2016-05-29 MED ORDER — CHLORHEXIDINE GLUCONATE 4 % EX LIQD: 60.0000 mL | Freq: Once | CUTANEOUS | Status: DC

## 2016-05-29 MED ORDER — HYDROMORPHONE HCL 1 MG/ML IJ SOLN
0.5000 mg | INTRAMUSCULAR | Status: DC | PRN
  Administered 2016-05-29: 0.5 mg via INTRAVENOUS
  Filled 2016-05-29: qty 1

## 2016-05-29 MED ORDER — ACETAMINOPHEN 650 MG RE SUPP: 650.0000 mg | Freq: Four times a day (QID) | RECTAL | Status: DC | PRN

## 2016-05-29 MED ORDER — KETOROLAC TROMETHAMINE 30 MG/ML IJ SOLN
INTRAMUSCULAR | Status: AC
  Filled 2016-05-29: qty 1

## 2016-05-29 MED ORDER — VENLAFAXINE HCL ER 37.5 MG PO CP24
37.5000 mg | ORAL_CAPSULE | Freq: Every day | ORAL | Status: DC
  Administered 2016-05-30: 37.5 mg via ORAL
  Filled 2016-05-29: qty 1

## 2016-05-29 MED ORDER — DEXAMETHASONE SODIUM PHOSPHATE 10 MG/ML IJ SOLN
10.0000 mg | Freq: Once | INTRAMUSCULAR | Status: AC
  Administered 2016-05-30: 10 mg via INTRAVENOUS
  Filled 2016-05-29: qty 1

## 2016-05-29 MED ORDER — LORATADINE 10 MG PO TABS
10.0000 mg | ORAL_TABLET | Freq: Every day | ORAL | Status: DC
  Filled 2016-05-29: qty 1

## 2016-05-29 MED ORDER — POLYETHYLENE GLYCOL 3350 17 G PO PACK: 17.0000 g | PACK | Freq: Every day | ORAL | Status: DC | PRN

## 2016-05-29 MED ORDER — PHENYLEPHRINE 40 MCG/ML (10ML) SYRINGE FOR IV PUSH (FOR BLOOD PRESSURE SUPPORT)
PREFILLED_SYRINGE | INTRAVENOUS | Status: AC
Start: 2016-05-29 — End: 2016-05-29
  Filled 2016-05-29: qty 10

## 2016-05-29 MED ORDER — CEFAZOLIN IN D5W 1 GM/50ML IV SOLN
1.0000 g | Freq: Four times a day (QID) | INTRAVENOUS | Status: AC
  Administered 2016-05-29 – 2016-05-30 (×2): 1 g via INTRAVENOUS
  Filled 2016-05-29 (×2): qty 50

## 2016-05-29 MED ORDER — SODIUM CHLORIDE 0.9 % IR SOLN
Status: DC | PRN
  Administered 2016-05-29: 3000 mL

## 2016-05-29 MED ORDER — METOCLOPRAMIDE HCL 5 MG/ML IJ SOLN
5.0000 mg | Freq: Three times a day (TID) | INTRAMUSCULAR | Status: DC | PRN
  Administered 2016-05-30: 10 mg via INTRAVENOUS
  Filled 2016-05-29: qty 2

## 2016-05-29 MED ORDER — PHENYLEPHRINE HCL 10 MG/ML IJ SOLN
INTRAMUSCULAR | Status: DC | PRN
  Administered 2016-05-29 (×5): 80 ug via INTRAVENOUS

## 2016-05-29 MED ORDER — HYDROGEN PEROXIDE 3 % EX SOLN
CUTANEOUS | Status: DC | PRN
  Administered 2016-05-29: 1

## 2016-05-29 MED ORDER — ONDANSETRON HCL 4 MG/2ML IJ SOLN
INTRAMUSCULAR | Status: DC | PRN
  Administered 2016-05-29: 4 mg via INTRAVENOUS

## 2016-05-29 MED ORDER — MENTHOL 3 MG MT LOZG
1.0000 | LOZENGE | OROMUCOSAL | Status: DC | PRN
Start: 2016-05-29 — End: 2016-05-30

## 2016-05-29 MED ORDER — WATER FOR IRRIGATION, STERILE IR SOLN
Status: DC | PRN
  Administered 2016-05-29: 2000 mL

## 2016-05-29 MED ORDER — METOCLOPRAMIDE HCL 5 MG PO TABS: 5.0000 mg | ORAL_TABLET | Freq: Three times a day (TID) | ORAL | Status: DC | PRN

## 2016-05-29 MED ORDER — KETOROLAC TROMETHAMINE 15 MG/ML IJ SOLN
7.5000 mg | Freq: Four times a day (QID) | INTRAMUSCULAR | Status: DC
  Administered 2016-05-29 – 2016-05-30 (×2): 7.5 mg via INTRAVENOUS
  Filled 2016-05-29 (×2): qty 1

## 2016-05-29 SURGICAL SUPPLY — 44 items
BAG DECANTER FOR FLEXI CONT (MISCELLANEOUS) IMPLANT
BAG SPEC THK2 15X12 ZIP CLS (MISCELLANEOUS)
BAG ZIPLOCK 12X15 (MISCELLANEOUS) IMPLANT
CAPT HIP TOTAL 2 ×2 IMPLANT
CHLORAPREP W/TINT 26ML (MISCELLANEOUS) ×3 IMPLANT
CLOTH BEACON ORANGE TIMEOUT ST (SAFETY) ×3 IMPLANT
COVER PERINEAL POST (MISCELLANEOUS) ×3 IMPLANT
DECANTER SPIKE VIAL GLASS SM (MISCELLANEOUS) ×3 IMPLANT
DRAPE SHEET LG 3/4 BI-LAMINATE (DRAPES) ×6 IMPLANT
DRAPE STERI IOBAN 125X83 (DRAPES) ×3 IMPLANT
DRAPE U-SHAPE 47X51 STRL (DRAPES) ×6 IMPLANT
DRSG AQUACEL AG ADV 3.5X10 (GAUZE/BANDAGES/DRESSINGS) ×3 IMPLANT
ELECT PENCIL ROCKER SW 15FT (MISCELLANEOUS) ×3 IMPLANT
ELECT REM PT RETURN 15FT ADLT (MISCELLANEOUS) ×3 IMPLANT
GAUZE SPONGE 4X4 12PLY STRL (GAUZE/BANDAGES/DRESSINGS) ×3 IMPLANT
GLOVE BIO SURGEON STRL SZ8.5 (GLOVE) ×6 IMPLANT
GLOVE BIOGEL PI IND STRL 8.5 (GLOVE) ×1 IMPLANT
GLOVE BIOGEL PI INDICATOR 8.5 (GLOVE) ×2
GOWN SPEC L3 XXLG W/TWL (GOWN DISPOSABLE) ×3 IMPLANT
HANDPIECE INTERPULSE COAX TIP (DISPOSABLE) ×3
HOLDER FOLEY CATH W/STRAP (MISCELLANEOUS) ×3 IMPLANT
HOOD PEEL AWAY FLYTE STAYCOOL (MISCELLANEOUS) ×6 IMPLANT
LIQUID BAND (GAUZE/BANDAGES/DRESSINGS) ×6 IMPLANT
MARKER SKIN DUAL TIP RULER LAB (MISCELLANEOUS) ×3 IMPLANT
NDL SPNL 18GX3.5 QUINCKE PK (NEEDLE) ×1 IMPLANT
NEEDLE SPNL 18GX3.5 QUINCKE PK (NEEDLE) ×3 IMPLANT
PACK ANTERIOR HIP CUSTOM (KITS) ×3 IMPLANT
SAW OSC TIP CART 19.5X105X1.3 (SAW) ×3 IMPLANT
SEALER BIPOLAR AQUA 6.0 (INSTRUMENTS) ×3 IMPLANT
SET HNDPC FAN SPRY TIP SCT (DISPOSABLE) ×1 IMPLANT
SOL PREP POV-IOD 4OZ 10% (MISCELLANEOUS) ×3 IMPLANT
SUT ETHIBOND NAB CT1 #1 30IN (SUTURE) ×6 IMPLANT
SUT MNCRL AB 3-0 PS2 18 (SUTURE) ×3 IMPLANT
SUT MON AB 2-0 CT1 36 (SUTURE) ×6 IMPLANT
SUT STRATAFIX PDO 1 14 VIOLET (SUTURE) ×3
SUT STRATFX PDO 1 14 VIOLET (SUTURE) ×1
SUT VIC AB 2-0 CT1 27 (SUTURE) ×3
SUT VIC AB 2-0 CT1 TAPERPNT 27 (SUTURE) ×1 IMPLANT
SUTURE STRATFX PDO 1 14 VIOLET (SUTURE) ×1 IMPLANT
SYR 50ML LL SCALE MARK (SYRINGE) ×1 IMPLANT
TRAY FOLEY CATH SILVER 14FR (SET/KITS/TRAYS/PACK) ×2 IMPLANT
TRAY FOLEY W/METER SILVER 16FR (SET/KITS/TRAYS/PACK) IMPLANT
WATER STERILE IRR 1500ML POUR (IV SOLUTION) ×3 IMPLANT
YANKAUER SUCT BULB TIP 10FT TU (MISCELLANEOUS) ×3 IMPLANT

## 2016-05-29 NOTE — Anesthesia Preprocedure Evaluation (Addendum)
Anesthesia Evaluation  Patient identified by MRN, date of birth, ID band Patient awake    Reviewed: Allergy & Precautions, NPO status , Patient's Chart, lab work & pertinent test results  Airway Mallampati: II  TM Distance: >3 FB Neck ROM: Full    Dental no notable dental hx.    Pulmonary shortness of breath, pneumonia, resolved, former smoker,    Pulmonary exam normal breath sounds clear to auscultation       Cardiovascular negative cardio ROS Normal cardiovascular exam Rhythm:Regular Rate:Normal     Neuro/Psych negative neurological ROS  negative psych ROS   GI/Hepatic Neg liver ROS, GERD  ,  Endo/Other  negative endocrine ROS  Renal/GU negative Renal ROS  negative genitourinary   Musculoskeletal  (+) Arthritis ,   Abdominal   Peds negative pediatric ROS (+)  Hematology negative hematology ROS (+)   Anesthesia Other Findings   Reproductive/Obstetrics negative OB ROS                            Anesthesia Physical Anesthesia Plan  ASA: II  Anesthesia Plan: Spinal   Post-op Pain Management:    Induction: Intravenous  Airway Management Planned:   Additional Equipment:   Intra-op Plan:   Post-operative Plan: Extubation in OR  Informed Consent: I have reviewed the patients History and Physical, chart, labs and discussed the procedure including the risks, benefits and alternatives for the proposed anesthesia with the patient or authorized representative who has indicated his/her understanding and acceptance.   Dental advisory given  Plan Discussed with: CRNA  Anesthesia Plan Comments: (Discussed risks and benefits of and differences between spinal and general. Discussed risks of spinal including headache, backache, failure, bleeding and hematoma, infection, and nerve damage. Patient consents to spinal. Questions answered. Coagulation studies and platelet count acceptable.  H/O  PE in 2003, and IVC filter placement. Denies any h/o easy or excessive bleeding or bruising.)      Anesthesia Quick Evaluation

## 2016-05-29 NOTE — Op Note (Signed)
OPERATIVE REPORT  SURGEON: Rod Can, MD   ASSISTANT: Staff.  PREOPERATIVE DIAGNOSIS: Left hip arthritis.   POSTOPERATIVE DIAGNOSIS: Left hip arthritis.   PROCEDURE: Left total hip arthroplasty, anterior approach.   IMPLANTS: DePuy Tri Lock stem, size 7, hi offset. DePuy Pinnacle Cup, size 56 mm. DePuy Altrx liner, size 36 by 56 mm, neutral. DePuy Biolox ceramic head ball, size 36 + 5 mm.  ANESTHESIA:  Spinal  ESTIMATED BLOOD LOSS: 350 mL.   ANTIBIOTICS: 2 g Ancef.  DRAINS: None.  COMPLICATIONS: None.   CONDITION: PACU - hemodynamically stable.  BRIEF CLINICAL NOTE: Colleen Moreno is a 65 y.o. female with a long-standing history of Left hip arthritis. After failing conservative management, the patient was indicated for total hip arthroplasty. The risks, benefits, and alternatives to the procedure were explained, and the patient elected to proceed.  PROCEDURE IN DETAIL: Surgical site was marked by myself. Spinal anesthesia was obtained in the pre-op holding area. Once inside the operative room, a foley catheter was inserted. The patient was then positioned on the [place] table. All bony prominences were well padded. The hip was prepped and draped in the normal sterile surgical fashion. A time-out was called verifying side and site of surgery. The patient received IV antibiotics within 60 minutes of beginning the procedure.  The direct anterior approach to the hip was performed through the Hueter interval. Lateral femoral circumflex vessels were treated with the Auqumantys. The anterior capsule was exposed and an inverted T capsulotomy was made.The femoral neck cut was made to the level of the templated cut. A corkscrew was placed into the head and the head was removed. The femoral head was found to have eburnated bone. The head was passed to the back table and was measured.  Acetabular exposure was achieved, and the pulvinar and labrum were excised. Sequental  reaming of the acetabulum was then performed up to a size 55 mm reamer. A 56 mm cup was then opened and impacted into place at approximately 40 degrees of abduction and 20 degrees of anteversion. The final polyethylene liner was impacted into place and acetabular osteophytes were removed.   I then gained femoral exposure taking care to protect the abductors and greater trochanter. This was performed using standard external rotation, extension, and adduction. The capsule was peeled off the inner aspect of the greater trochanter, taking care to preserve the short external rotators. A cookie cutter was used to enter the femoral canal, and then the femoral canal finder was placed. Sequential broaching was performed up to a size 7. Calcar planer was used on the femoral neck remnant. I placed a hi offset neck and a trial head ball. The hip was reduced. Leg lengths and offset were checked fluoroscopically. The hip was dislocated and trial components were removed. The final implants were placed, and the hip was reduced.  Fluoroscopy was used to confirm component position and leg lengths. At 90 degrees of external rotation and full extension, the hip was stable to an anterior directed force.  The wound was copiously irrigated with a dilute betadine solution followed by normal saline. Marcaine solution was injected into the periarticular soft tissue. The wound was closed in layers using #1 Vicryl and V-Loc for the fascia, 2-0 Vicryl for the subcutaneous fat, 2-0 Monocryl for the deep dermal layer, 3-0 running Monocryl subcuticular stitch, and Dermabond for the skin. Once the glue was fully dried, an Aquacell Ag dressing was applied. The patient was transported to the recovery room in  stable condition. Sponge, needle, and instrument counts were correct at the end of the case x2. The patient tolerated the procedure well and there were no known complications.

## 2016-05-29 NOTE — Anesthesia Postprocedure Evaluation (Signed)
Anesthesia Post Note  Patient: Colleen Moreno  Procedure(s) Performed: Procedure(s) (LRB): LEFT TOTAL HIP ARTHROPLASTY ANTERIOR APPROACH (Left)  Patient location during evaluation: PACU Anesthesia Type: Spinal Level of consciousness: oriented and awake and alert Pain management: pain level controlled Vital Signs Assessment: post-procedure vital signs reviewed and stable Respiratory status: spontaneous breathing, respiratory function stable and patient connected to nasal cannula oxygen Cardiovascular status: blood pressure returned to baseline and stable Postop Assessment: no headache, no backache, patient able to bend at knees and spinal receding Anesthetic complications: no    Last Vitals:  Vitals:   05/29/16 1615 05/29/16 1622  BP: 98/62 (!) 89/64  Pulse: (!) 55 (!) 57  Resp: 18 16  Temp:  36.6 C    Last Pain:  Vitals:   05/29/16 1600  PainSc: 4                  Cristan Hout J

## 2016-05-29 NOTE — Interval H&P Note (Signed)
History and Physical Interval Note:  05/29/2016 12:40 PM  Colleen Moreno  has presented today for surgery, with the diagnosis of DJD LEFT HIP   The various methods of treatment have been discussed with the patient and family. After consideration of risks, benefits and other options for treatment, the patient has consented to  Procedure(s): LEFT TOTAL HIP ARTHROPLASTY ANTERIOR APPROACH (Left) as a surgical intervention .  The patient's history has been reviewed, patient examined, no change in status, stable for surgery.  I have reviewed the patient's chart and labs.  Questions were answered to the patient's satisfaction.     Henrique Parekh, Horald Pollen

## 2016-05-29 NOTE — Transfer of Care (Signed)
Immediate Anesthesia Transfer of Care Note  Patient: Colleen Moreno  Procedure(s) Performed: Procedure(s): LEFT TOTAL HIP ARTHROPLASTY ANTERIOR APPROACH (Left)  Patient Location: PACU  Anesthesia Type:Spinal  Level of Consciousness: sedated  Airway & Oxygen Therapy: Patient Spontanous Breathing and Patient connected to face mask oxygen  Post-op Assessment: Report given to RN and Post -op Vital signs reviewed and stable  Post vital signs: Reviewed and stable  Last Vitals:  Vitals:   05/29/16 1041  BP: 127/68  Pulse: 75  Resp: 16  Temp: 36.6 C    Last Pain: There were no vitals filed for this visit.    Patients Stated Pain Goal: 3 ([id number])  Complications: No apparent anesthesia complications

## 2016-05-29 NOTE — H&P (View-Only) (Signed)
TOTAL HIP ADMISSION H&P  Patient is admitted for left total hip arthroplasty.  Subjective:  Chief Complaint: left hip pain  HPI: Colleen Moreno, 65 y.o. female, has a history of pain and functional disability in the left hip(s) due to arthritis and patient has failed non-surgical conservative treatments for greater than 12 weeks to include NSAID's and/or analgesics, corticosteriod injections, flexibility and strengthening excercises, use of assistive devices, weight reduction as appropriate and activity modification.  Onset of symptoms was abrupt starting 1 years ago with rapidlly worsening course since that time.The patient noted no past surgery on the left hip(s).  Patient currently rates pain in the left hip at 10 out of 10 with activity. Patient has night pain, worsening of pain with activity and weight bearing, pain that interfers with activities of daily living, pain with passive range of motion and crepitus. Patient has evidence of subchondral cysts, subchondral sclerosis, periarticular osteophytes and joint space narrowing by imaging studies. This condition presents safety issues increasing the risk of falls.   There is no current active infection.  Patient Active Problem List   Diagnosis Date Noted  . Preoperative clearance 03/08/2016  . Tachycardia 02/21/2015  . Exertional shortness of breath 02/21/2015  . Hyperlipidemia 08/01/2014  . Fatigue 08/01/2014  . GERD (gastroesophageal reflux disease) 08/01/2014  . History of pulmonary embolus (PE) 08/14/2013  . Tobacco use 08/14/2013  . Insomnia 08/14/2013  . Breast cancer of lower-outer quadrant of left female breast ([place]) 05/28/2011  . 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 Allergy   . Breast cancer ([place])    left  . 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 CLL (chronic lymphoblastic leukemia)   . Clotting disorder ([place]) 2006   blood clot  . GERD (gastroesophageal  reflux disease)   . Hyperlipidemia     Past Surgical History:  Procedure Laterality Date  . CARDIAC CATHETERIZATION  2004   right femoral  . GALLBLADDER SURGERY  2003  . HERNIA REPAIR    . KNEE ARTHROSCOPY  2006  . MASTECTOMY PARTIAL / LUMPECTOMY  06/11/2011   Left  . THYROID SURGERY  1979     (Not in a hospital admission) No Known Allergies  Social History  Substance Use Topics  . Smoking status: Current Every Day Smoker    Packs/day: 1.00    Years: 1.00    Types: Cigarettes  . Smokeless tobacco: Never Used  . Alcohol use No    Family History  Problem Relation Age of Onset  . Cancer Mother     some type uterus or cervixor ovary     Review of Systems  Constitutional: Negative.   HENT: Negative.   Eyes: Negative.   Respiratory: Negative.   Cardiovascular: Negative.   Gastrointestinal: Negative.   Genitourinary: Negative.   Musculoskeletal: Positive for joint pain.  Skin: Negative.   Neurological: Negative.   Endo/Heme/Allergies: Negative.   Psychiatric/Behavioral: Negative.     Objective:  Physical Exam  Constitutional: She is oriented to person, place, and time. She appears well-developed and well-nourished.  HENT:  Head: Normocephalic and atraumatic.  Eyes: Conjunctivae and EOM are normal. Pupils are equal, round, and reactive to light.  Neck: Normal range of motion. Neck supple.  Cardiovascular: Normal rate, regular rhythm and intact distal pulses.   Respiratory: Effort normal. No respiratory distress.  GI: Soft. Bowel sounds are normal. She exhibits no distension.  Genitourinary:  Genitourinary Comments: deferred  Musculoskeletal:  Left hip: She exhibits decreased range of motion and bony tenderness.  Neurological: She is alert and oriented to person, place, and time. She has normal reflexes.  Skin: Skin is warm and dry.  Psychiatric: She has a normal mood and affect. Her behavior is normal. Judgment and thought content normal.    Vital signs  in last 24 hours: @VSRANGES @  Labs:   Estimated body mass index is 25.95 kg/m as calculated from the following:   Height as of 03/06/16: 5' 8.5" (1.74 m).   Weight as of 03/06/16: 78.6 kg (173 lb 3.2 oz).   Imaging Review Plain radiographs demonstrate severe degenerative joint disease of the left hip(s). The bone quality appears to be adequate for age and reported activity level.  Assessment/Plan:  End stage arthritis, left hip(s)  The patient history, physical examination, clinical judgement of the provider and imaging studies are consistent with end stage degenerative joint disease of the left hip(s) and total hip arthroplasty is deemed medically necessary. The treatment options including medical management, injection therapy, arthroscopy and arthroplasty were discussed at length. The risks and benefits of total hip arthroplasty were presented and reviewed. The risks due to aseptic loosening, infection, stiffness, dislocation/subluxation,  thromboembolic complications and other imponderables were discussed.  The patient acknowledged the explanation, agreed to proceed with the plan and consent was signed. Patient is being admitted for inpatient treatment for surgery, pain control, PT, OT, prophylactic antibiotics, VTE prophylaxis, progressive ambulation and ADL's and discharge planning.The patient is planning to be discharged home with home health services. Is having temporary IVC filter placed. H/o CLL.

## 2016-05-29 NOTE — Anesthesia Procedure Notes (Signed)
Spinal  Start time: 05/29/2016 12:53 PM End time: 05/29/2016 12:55 PM Staffing Resident/CRNA: Harle Stanford R Performed: resident/CRNA  Preanesthetic Checklist Completed: patient identified, site marked, surgical consent, pre-op evaluation, timeout performed, IV checked, risks and benefits discussed and monitors and equipment checked Spinal Block Patient position: sitting Prep: Betadine Patient monitoring: heart rate, cardiac monitor, blood pressure and continuous pulse ox Approach: midline Location: L3-4 Injection technique: single-shot Needle Needle gauge: 24 G Needle length: 10 cm Needle insertion depth: 7 cm Assessment Sensory level: T6 Additional Notes Time out performed. SAB kit date checked. SAB without difficulty.

## 2016-05-30 LAB — CBC
HCT: 31.1 % — ABNORMAL LOW (ref 36.0–46.0)
Hemoglobin: 10.3 g/dL — ABNORMAL LOW (ref 12.0–15.0)
MCH: 30.5 pg (ref 26.0–34.0)
MCHC: 33.1 g/dL (ref 30.0–36.0)
MCV: 92 fL (ref 78.0–100.0)
PLATELETS: 284 10*3/uL (ref 150–400)
RBC: 3.38 MIL/uL — AB (ref 3.87–5.11)
RDW: 13.6 % (ref 11.5–15.5)
WBC: 14.3 10*3/uL — AB (ref 4.0–10.5)

## 2016-05-30 LAB — BASIC METABOLIC PANEL
ANION GAP: 6 (ref 5–15)
BUN: 16 mg/dL (ref 6–20)
CALCIUM: 8.5 mg/dL — AB (ref 8.9–10.3)
CO2: 26 mmol/L (ref 22–32)
Chloride: 107 mmol/L (ref 101–111)
Creatinine, Ser: 0.8 mg/dL (ref 0.44–1.00)
GFR calc Af Amer: >60 mL/min (ref >60)
GLUCOSE: 113 mg/dL — AB (ref 65–99)
POTASSIUM: 4 mmol/L (ref 3.5–5.1)
SODIUM: 139 mmol/L (ref 135–145)

## 2016-05-30 MED ORDER — SENNA 8.6 MG PO TABS: 2.0000 | ORAL_TABLET | Freq: Every day | ORAL | 3 refills | Status: DC

## 2016-05-30 MED ORDER — HYDROCODONE-ACETAMINOPHEN 5-325 MG PO TABS
1.0000 | ORAL_TABLET | ORAL | 0 refills | Status: DC | PRN
Start: 2016-05-30 — End: 2016-10-14

## 2016-05-30 MED ORDER — PROMETHAZINE HCL 25 MG/ML IJ SOLN
6.2500 mg | Freq: Once | INTRAMUSCULAR | Status: AC
  Administered 2016-05-30: 6.25 mg via INTRAVENOUS
  Filled 2016-05-30: qty 1

## 2016-05-30 MED ORDER — DOCUSATE SODIUM 100 MG PO CAPS: 100.0000 mg | ORAL_CAPSULE | Freq: Two times a day (BID) | ORAL | 3 refills | Status: DC

## 2016-05-30 MED ORDER — ONDANSETRON HCL 4 MG PO TABS: 4.0000 mg | ORAL_TABLET | Freq: Four times a day (QID) | ORAL | 0 refills | Status: DC | PRN

## 2016-05-30 MED ORDER — APIXABAN 2.5 MG PO TABS: 2.5000 mg | ORAL_TABLET | Freq: Two times a day (BID) | ORAL | 0 refills | Status: AC

## 2016-05-30 NOTE — Progress Notes (Signed)
Physical Therapy Treatment Patient Details Name: Colleen Moreno MRN: [id number] DOB: 03-19-1951 Today's Date: June 24, 2016    History of Present Illness s/p L DA THA    PT Comments    Pt very motivated but ltd by ongoing nausea.  Pt initiated therex program and progression of same with written instructions provided.  Pt also reviewed car transfers.  Follow Up Recommendations  No PT follow up     Equipment Recommendations  None recommended by PT    Recommendations for Other Services OT consult     Precautions / Restrictions Precautions Precautions: Fall Restrictions Weight Bearing Restrictions: No Other Position/Activity Restrictions: WBAT    Mobility  Bed Mobility               General bed mobility comments: Pt in bed and declines OOB  Transfers                    Ambulation/Gait                 Stairs            Wheelchair Mobility    Modified Rankin (Stroke Patients Only)       Balance                                    Cognition Arousal/Alertness: Awake/alert Behavior During Therapy: WFL for tasks assessed/performed Overall Cognitive Status: Within Functional Limits for tasks assessed                      Exercises Total Joint Exercises Ankle Circles/Pumps: AROM;Both;15 reps;Supine Quad Sets: AROM;Both;10 reps;Supine Heel Slides: AAROM;Left;20 reps;Supine Hip ABduction/ADduction: AAROM;Left;15 reps;Supine    General Comments        Pertinent Vitals/Pain Pain Assessment: 0-10 Pain Score: 2  Pain Location: L hip Pain Descriptors / Indicators: Aching Pain Intervention(s): Limited activity within patient's tolerance;Monitored during session;Premedicated before session;Ice applied    Home Living                      Prior Function            PT Goals (current goals can now be found in the care plan section) Acute Rehab PT Goals Patient Stated Goal: return to independence PT  Goal Formulation: With patient Time For Goal Achievement: June 24, 2016 Potential to Achieve Goals: Good Progress towards PT goals: Progressing toward goals    Frequency    7X/week      PT Plan Current plan remains appropriate    Co-evaluation             End of Session   Activity Tolerance: Patient tolerated treatment well;Other (comment) (N&V) Patient left: in bed;with call bell/phone within reach     Time: [id number] PT Time Calculation (min) (ACUTE ONLY): 23 min  Charges:  $Therapeutic Exercise: 8-22 mins $Therapeutic Activity: 8-22 mins                    G Codes:      Pema Thomure June 24, 2016, 2:24 PM

## 2016-05-30 NOTE — Evaluation (Signed)
Physical Therapy Evaluation Patient Details Name: Colleen Moreno MRN: [id number] DOB: Feb 25, 1951 Today's Date: 05/30/2016   History of Present Illness  s/p L DA THA  Clinical Impression  Pt s/p L THR presents with decreased L LE strength/ROM and post op pain and nausea limiting functional mobility.  Pt should progress to dc home with family assist.    Follow Up Recommendations No PT follow up (Per Dr Lyla Glassing)    Equipment Recommendations  None recommended by PT    Recommendations for Other Services OT consult     Precautions / Restrictions Precautions Precautions: Fall Restrictions Weight Bearing Restrictions: No Other Position/Activity Restrictions: WBAT      Mobility  Bed Mobility Overal bed mobility: Needs Assistance Bed Mobility: Sit to Supine       Sit to supine: Min guard   General bed mobility comments: cues for sequence and use of R LE to self assist  Transfers Overall transfer level: Needs assistance Equipment used: Rolling walker (2 wheeled) Transfers: Sit to/from Stand Sit to Stand: Min guard;Supervision         General transfer comment: min cues for use of UEs to self assist  Ambulation/Gait Ambulation/Gait assistance: Min guard;Supervision Ambulation Distance (Feet): 160 Feet Assistive device: Rolling walker (2 wheeled) Gait Pattern/deviations: Step-to pattern;Step-through pattern;Decreased step length - right;Decreased step length - left;Shuffle;Trunk flexed Gait velocity: decr Gait velocity interpretation: Below normal speed for age/gender General Gait Details: cues for posture, position from RW and initial sequence  Stairs Stairs: Yes Stairs assistance: Min assist Stair Management: One rail Left;Forwards;With cane;Step to pattern Number of Stairs: 5 General stair comments: cues for sequence and foot/cane placement  Wheelchair Mobility    Modified Rankin (Stroke Patients Only)       Balance                                              Pertinent Vitals/Pain Pain Assessment: 0-10 Pain Score: 2  Pain Location: L hip Pain Descriptors / Indicators: Aching Pain Intervention(s): Limited activity within patient's tolerance;Monitored during session;Premedicated before session;Ice applied    Home Living Family/patient expects to be discharged to:: Private residence Living Arrangements: Spouse/significant other Available Help at Discharge: Family Type of Home: House Home Access: Stairs to enter Entrance Stairs-Rails: Right Entrance Stairs-Number of Steps: 3 Home Layout: Able to live on main level with bedroom/bathroom Home Equipment: Shower seat - built in;Walker - 2 wheels;Cane - single point;Crutches      Prior Function Level of Independence: Independent               Hand Dominance        Extremity/Trunk Assessment   Upper Extremity Assessment: Overall WFL for tasks assessed           Lower Extremity Assessment: LLE deficits/detail      Cervical / Trunk Assessment: Normal  Communication   Communication: No difficulties  Cognition Arousal/Alertness: Awake/alert Behavior During Therapy: WFL for tasks assessed/performed Overall Cognitive Status: Within Functional Limits for tasks assessed                      General Comments      Exercises     Assessment/Plan    PT Assessment Patient needs continued PT services  PT Problem List Decreased strength;Decreased range of motion;Decreased activity tolerance;Decreased mobility;Decreased knowledge of use of DME;Pain;Other (comment)  PT Diagnosis   Pain - Plan: CANCELED: DG Pelvis 1-2 Views, CANCELED: DG Pelvis 1-2 Views  Hip pain - Plan: DG HIP OPERATIVE UNILAT WITH PELVIS LEFT, DG HIP OPERATIVE UNILAT WITH PELVIS LEFT  Primary osteoarthritis of left hip  Postop check - Plan: DG Pelvis Portable, DG Pelvis Portable    PT Treatment Interventions DME instruction;Gait training;Stair  training;Functional mobility training;Therapeutic activities;Therapeutic exercise;Patient/family education    PT Goals (Current goals can be found in the Care Plan section)  Acute Rehab PT Goals Patient Stated Goal: return to independence PT Goal Formulation: With patient Time For Goal Achievement: 2016/06/16 Potential to Achieve Goals: Good    Frequency 7X/week   Barriers to discharge        Co-evaluation               End of Session Equipment Utilized During Treatment: Gait belt Activity Tolerance: Patient tolerated treatment well;Other (comment) (nausea) Patient left: in bed;with call bell/phone within reach Nurse Communication: Mobility status         Time: [id number] PT Time Calculation (min) (ACUTE ONLY): 32 min   Charges:   PT Evaluation $PT Eval Low Complexity: 1 Procedure PT Treatments $Gait Training: 8-22 mins   PT G Codes:        Dalya Maselli 06/16/16, 1:31 PM

## 2016-05-30 NOTE — Progress Notes (Signed)
Discharge planning, no HH needs identified. Per note plans to d/c home without HHPT, has all equipment at home already. Spoke with patient at bedside and she confirms above.  [phone]

## 2016-05-30 NOTE — Discharge Summary (Signed)
Physician Discharge Summary  Patient ID: Colleen Moreno MRN: [id number] DOB/AGE: [birth year] 65 y.o.  Admit date: 05/29/2016 Discharge date: 05/30/2016  Admission Diagnoses:  Primary osteoarthritis of left hip  Discharge Diagnoses:  Principal Problem:   Primary osteoarthritis of left hip   Past Medical History:  Diagnosis Date  . Allergy   . Arthritis   . Breast cancer ([place])    left  . 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 CLL (chronic lymphoblastic leukemia)   . Clotting disorder ([place]) 2006   blood clot  . GERD (gastroesophageal reflux disease)   . Hyperlipidemia   . Pneumonia     Surgeries: Procedure(s): LEFT TOTAL HIP ARTHROPLASTY ANTERIOR APPROACH on 05/29/2016   Consultants (if any):   Discharged Condition: Improved  Hospital Course: Colleen Moreno is an 65 y.o. female who was admitted 05/29/2016 with a diagnosis of Primary osteoarthritis of left hip and went to the operating room on 05/29/2016 and underwent the above named procedures.    She was given perioperative antibiotics:  Anti-infectives    Start     Dose/Rate Route Frequency Ordered Stop   05/29/16 1800  ceFAZolin (ANCEF) IVPB 1 g/50 mL premix     1 g 100 mL/hr over 30 Minutes Intravenous Every 6 hours 05/29/16 1629 05/30/16 0123   05/29/16 1045  ceFAZolin (ANCEF) IVPB 2g/100 mL premix     2 g 200 mL/hr over 30 Minutes Intravenous On call to O.R. 05/29/16 1045 05/29/16 1259    .  She was given sequential compression devices, early ambulation, and apixaban for DVT prophylaxis.  She benefited maximally from the hospital stay and there were no complications.    Recent vital signs:  Vitals:   05/30/16 0210 05/30/16 0633  BP: (!) 109/57 117/68  Pulse: 60 65  Resp: 18 18  Temp: 98.4 F (36.9 C) 98.2 F (36.8 C)    Recent laboratory studies:  Lab Results  Component Value Date   HGB 10.3 (L) 05/30/2016   HGB 13.6 05/20/2016   HGB 14.2 05/14/2016   Lab Results  Component Value  Date   WBC 14.3 (H) 05/30/2016   PLT 284 05/30/2016   Lab Results  Component Value Date   INR 0.93 05/14/2016   Lab Results  Component Value Date   NA 139 05/30/2016   K 4.0 05/30/2016   CL 107 05/30/2016   CO2 26 05/30/2016   BUN 16 05/30/2016   CREATININE 0.80 05/30/2016   GLUCOSE 113 (H) 05/30/2016    Discharge Medications:     Medication List    STOP taking these medications   ibuprofen 200 MG tablet Commonly known as:  ADVIL,MOTRIN     TAKE these medications   apixaban 2.5 MG Tabs tablet Commonly known as:  ELIQUIS Take 1 tablet (2.5 mg total) by mouth every 12 (twelve) hours.   atorvastatin 40 MG tablet Commonly known as:  LIPITOR Take 40 mg by mouth daily.   cetirizine 10 MG tablet Commonly known as:  ZYRTEC Take 10 mg by mouth daily.   docusate sodium 100 MG capsule Commonly known as:  COLACE Take 1 capsule (100 mg total) by mouth 2 (two) times daily.   HYDROcodone-acetaminophen 5-325 MG tablet Commonly known as:  NORCO/VICODIN Take 1-2 tablets by mouth every 4 (four) hours as needed (breakthrough pain).   lansoprazole 30 MG capsule Commonly known as:  PREVACID Take 30 mg by mouth daily.   NASONEX 50 MCG/ACT nasal spray Generic drug:  mometasone Inhale  2 sprays into the lungs daily as needed. Reported on 10/15/2015   ondansetron 4 MG tablet Commonly known as:  ZOFRAN Take 1 tablet (4 mg total) by mouth every 6 (six) hours as needed for nausea.   senna 8.6 MG Tabs tablet Commonly known as:  SENOKOT Take 2 tablets (17.2 mg total) by mouth at bedtime.   venlafaxine XR 37.5 MG 24 hr capsule Commonly known as:  EFFEXOR-XR Take 37.5 mg by mouth daily.       Diagnostic Studies: Ir Ivc Filter Plmt / S&i /img Guid/mod Sed  Result Date: 05/14/2016 CLINICAL DATA:  History of prior lower extremity deep vein thrombosis. Request has been made to place a prophylactic IVC filter preoperatively prior to scheduled left hip arthroplasty. EXAM: 1.  ULTRASOUND GUIDANCE FOR VASCULAR ACCESS OF THE RIGHT INTERNAL JUGULAR VEIN. 2. IVC VENOGRAM. 3. PERCUTANEOUS IVC FILTER PLACEMENT. ANESTHESIA/SEDATION: 1.0 mg IV Versed; 50 mcg IV Fentanyl. Total Moderate Sedation Time: 20 minutes. The patient's level of consciousness and physiologic status were continuously monitored during the procedure by Radiology nursing. A time-out was performed prior to initiating the procedure. CONTRAST:  35 mL Isovue-300 FLUOROSCOPY TIME:  1 minutes and 36 seconds. PROCEDURE: The procedure, risks, benefits, and alternatives were explained to the patient. Questions regarding the procedure were encouraged and answered. The patient understands and consents to the procedure. A time-out was performed prior to initiating the procedure. The right neck was prepped with chlorhexidine in a sterile fashion, and a sterile drape was applied covering the operative field. A sterile gown and sterile gloves were used for the procedure. Local anesthesia was provided with 1% Lidocaine. Ultrasound was utilized to confirm patency of the right internal jugular vein. Under direct ultrasound guidance, a 21 gauge needle was advanced into the right internal jugular vein with ultrasound image documentation performed. After securing access with a micropuncture dilator, a guidewire was advanced into the inferior vena cava. A deployment sheath was advanced over the guidewire. This was utilized to perform IVC venography. The deployment sheath was further positioned in an appropriate location for filter deployment. A Bard Denali IVC filter was then advanced in the sheath. This was then fully deployed in the infrarenal IVC. Final filter position was confirmed with a fluoroscopic spot image. After the procedure the sheath was removed and hemostasis obtained with manual compression. COMPLICATIONS: None. FINDINGS: IVC venography demonstrates a normal caliber IVC with no evidence of thrombus. Renal veins are identified  bilaterally. The IVC filter was successfully positioned below the level of the renal veins and is appropriately oriented. This IVC filter has both permanent and retrievable indications. IMPRESSION: Placement of percutaneous IVC filter in infrarenal IVC. IVC venogram shows no evidence of IVC thrombus and normal caliber of the inferior vena cava. This filter does have both permanent and retrievable indications. This IVC filter is potentially retrievable. The patient will be assessed for filter retrieval by Interventional Radiology in approximately 8-12 weeks. Further recommendations regarding filter retrieval, continued surveillance or declaration of device permanence, will be made at that time. Electronically Signed   By: Aletta Edouard M.D.   On: 05/14/2016 16:52   Dg Pelvis Portable  Result Date: 05/29/2016 CLINICAL DATA:  Left hip surgery. EXAM: PORTABLE PELVIS 1-2 VIEWS COMPARISON:  CT 03/17/2013 FINDINGS: There is a left hip arthroplasty. The hip appears to be located on this single view. The entire left femoral stem is visualized without a periprosthetic fracture. No gross abnormality to the right hip. Multiple calcifications in the pelvis. Pelvic  bony ring is intact. Disc space narrowing at L4-L5. IMPRESSION: Left hip arthroplasty without complicating features. Electronically Signed   By: Markus Daft M.D.   On: 05/29/2016 15:59   Dg C-arm 61-120 Min-no Report  Result Date: 05/29/2016 CLINICAL DATA: pain C-ARM 61-120 MINUTES Fluoroscopy was utilized by the requesting physician.  No radiographic interpretation.   Mm Diag Breast Tomo Bilateral  Result Date: 05/26/2016 CLINICAL DATA:  65 year old female presenting for annual evaluation status post left breast lumpectomy in 2012. The patient has history of CLL. She states that she has had several months of intermittent right axillary discomfort. This is not present today. EXAM: 2D DIGITAL DIAGNOSTIC BILATERAL MAMMOGRAM WITH CAD AND ADJUNCT TOMO  COMPARISON:  Previous exam(s). ACR Breast Density Category c: The breast tissue is heterogeneously dense, which may obscure small masses. FINDINGS: The left breast lumpectomy site is stable. No suspicious calcifications, masses or areas of distortion are seen in the bilateral breasts. Mammographic images were processed with CAD. IMPRESSION: 1. Stable left breast lumpectomy site. No mammographic evidence of malignancy in the bilateral breasts. RECOMMENDATION: 1.  Diagnostic mammogram is suggested in 1 year. (Code:[id number]) 2. If there is return of the right axillary pain, progressive pain or new palpable areas, I advised the patient to immediately notify her physician for evaluation. I have discussed the findings and recommendations with the patient. Results were also provided in writing at the conclusion of the visit. If applicable, a reminder letter will be sent to the patient regarding the next appointment. BI-RADS CATEGORY  2: Benign. Electronically Signed   By: Ammie Ferrier M.D.   On: 05/26/2016 13:11   Dg Hip Operative Unilat With Pelvis Left  Result Date: 05/29/2016 CLINICAL DATA:  Left total hip replacement EXAM: OPERATIVE left HIP (WITH PELVIS IF PERFORMED) 2 VIEWS TECHNIQUE: Fluoroscopic spot image(s) were submitted for interpretation post-operatively. COMPARISON:  None. FINDINGS: Total fluoroscopy time was 0.5 minutes. 2 low resolution intraoperative spot images of the left hip are submitted. There is a left hip replacement with anatomic alignment. Probable calcified phleboliths in the pelvis. IMPRESSION: Intraoperative fluoroscopic images of left hip acquired during left hip replacement Electronically Signed   By: Donavan Foil M.D.   On: 05/29/2016 15:47    Disposition: 01-Home or Self Care  Discharge Instructions    Call MD / Call 911    Complete by:  As directed    If you experience chest pain or shortness of breath, CALL 911 and be transported to the hospital emergency room.  If you  develope a fever above 101 F, pus (white drainage) or increased drainage or redness at the wound, or calf pain, call your surgeon's office.   Constipation Prevention    Complete by:  As directed    Drink plenty of fluids.  Prune juice may be helpful.  You may use a stool softener, such as Colace (over the counter) 100 mg twice a day.  Use MiraLax (over the counter) for constipation as needed.   Diet - low sodium heart healthy    Complete by:  As directed    Driving restrictions    Complete by:  As directed    No driving for 6 weeks   Increase activity slowly as tolerated    Complete by:  As directed    Lifting restrictions    Complete by:  As directed    No lifting for 6 weeks   TED hose    Complete by:  As directed    Use  stockings (TED hose) for 2 weeks on both leg(s).  You may remove them at night for sleeping.      Follow-up Information    Paz Winsett, Horald Pollen, MD. Schedule an appointment as soon as possible for a visit in 2 weeks.   Specialty:  Orthopedic Surgery Why:  For wound re-check Contact information: [address] [address] [phone]            Signed: Elie Goody 05/30/2016, 7:11 AM

## 2016-05-30 NOTE — Discharge Instructions (Signed)
°Dr. Brian Swinteck °Joint Replacement Specialist °[facility] °[address] °[place],  [address] °[phone] ° ° °TOTAL HIP REPLACEMENT POSTOPERATIVE DIRECTIONS ° ° ° °Hip Rehabilitation, Guidelines Following Surgery  ° °WEIGHT BEARING °Weight bearing as tolerated with assist device (walker, cane, etc) as directed, use it as long as suggested by your surgeon or therapist, typically at least 4-6 weeks. ° °The results of a hip operation are greatly improved after range of motion and muscle strengthening exercises. Follow all safety measures which are given to protect your hip. If any of these exercises cause increased pain or swelling in your joint, decrease the amount until you are comfortable again. Then slowly increase the exercises. Call your caregiver if you have problems or questions.  ° °HOME CARE INSTRUCTIONS  °Most of the following instructions are designed to prevent the dislocation of your new hip.  °Remove items at home which could result in a fall. This includes throw rugs or furniture in walking pathways.  °Continue medications as instructed at time of discharge. °· You may have some home medications which will be placed on hold until you complete the course of blood thinner medication. °· You may start showering once you are discharged home. Do not remove your dressing. °Do not put on socks or shoes without following the instructions of your caregivers.   °Sit on chairs with arms. Use the chair arms to help push yourself up when arising.  °Arrange for the use of a toilet seat elevator so you are not sitting low.  °· Walk with walker as instructed.  °You may resume a sexual relationship in one month or when given the OK by your caregiver.  °Use walker as long as suggested by your caregivers.  °You may put full weight on your legs and walk as much as is comfortable. °Avoid periods of inactivity such as sitting longer than an hour when not asleep. This helps prevent  blood clots.  °You may return to work once you are cleared by your surgeon.  °Do not drive a car for 6 weeks or until released by your surgeon.  °Do not drive while taking narcotics.  °Wear elastic stockings for two weeks following surgery during the day but you may remove then at night.  °Make sure you keep all of your appointments after your operation with all of your doctors and caregivers. You should call the office at the above phone number and make an appointment for approximately two weeks after the date of your surgery. °Please pick up a stool softener and laxative for home use as long as you are requiring pain medications. °· ICE to the affected hip every three hours for 30 minutes at a time and then as needed for pain and swelling. Continue to use ice on the hip for pain and swelling from surgery. You may notice swelling that will progress down to the foot and ankle.  This is normal after surgery.  Elevate the leg when you are not up walking on it.   °It is important for you to complete the blood thinner medication as prescribed by your doctor. °· Continue to use the breathing machine which will help keep your temperature down.  It is common for your temperature to cycle up and down following surgery, especially at night when you are not up moving around and exerting yourself.  The breathing machine keeps your lungs expanded and your temperature down. ° °RANGE OF MOTION AND STRENGTHENING EXERCISES  °These exercises are   designed to help you keep full movement of your hip joint. Follow your caregiver's or physical therapist's instructions. Perform all exercises about fifteen times, three times per day or as directed. Exercise both hips, even if you have had only one joint replacement. These exercises can be done on a training (exercise) mat, on the floor, on a table or on a bed. Use whatever works the best and is most comfortable for you. Use music or television while you are exercising so that the exercises  are a pleasant break in your day. This will make your life better with the exercises acting as a break in routine you can look forward to.  Lying on your back, slowly slide your foot toward your buttocks, raising your knee up off the floor. Then slowly slide your foot back down until your leg is straight again.  Lying on your back spread your legs as far apart as you can without causing discomfort.  Lying on your side, raise your upper leg and foot straight up from the floor as far as is comfortable. Slowly lower the leg and repeat.  Lying on your back, tighten up the muscle in the front of your thigh (quadriceps muscles). You can do this by keeping your leg straight and trying to raise your heel off the floor. This helps strengthen the largest muscle supporting your knee.  Lying on your back, tighten up the muscles of your buttocks both with the legs straight and with the knee bent at a comfortable angle while keeping your heel on the floor.   SKILLED REHAB INSTRUCTIONS: If the patient is transferred to a skilled rehab facility following release from the hospital, a list of the current medications will be sent to the facility for the patient to continue.  When discharged from the skilled rehab facility, please have the facility set up the patient's [place] prior to being released. Also, the skilled facility will be responsible for providing the patient with their medications at time of release from the facility to include their pain medication and their blood thinner medication. If the patient is still at the rehab facility at time of the two week follow up appointment, the skilled rehab facility will also need to assist the patient in arranging follow up appointment in our office and any transportation needs.  MAKE SURE YOU:  Understand these instructions.  Will watch your condition.  Will get help right away if you are not doing well or get worse.  Pick up stool softner and  laxative for home use following surgery while on pain medications. Do not remove your dressing. The dressing is waterproof--it is OK to take showers. Continue to use ice for pain and swelling after surgery. Do not use any lotions or creams on the incision until instructed by your surgeon. Total Hip Protocol.  Information on my medicine - ELIQUIS (apixaban)  This medication education was reviewed with me or my healthcare representative as part of my discharge preparation.  The pharmacist that spoke with me during my hospital stay was:  Altha Harm  Why was Eliquis prescribed for you? Eliquis was prescribed for you to reduce the risk of blood clots forming after orthopedic surgery.    What do You need to know about Eliquis? Take your Eliquis TWICE DAILY - one tablet in the morning and one tablet in the evening with or without food.  It would be best to take the dose about the same time each day.  If you  have difficulty swallowing the tablet whole please discuss with your pharmacist how to take the medication safely.  Take Eliquis exactly as prescribed by your doctor and DO NOT stop taking Eliquis without talking to the doctor who prescribed the medication.  Stopping without other medication to take the place of Eliquis may increase your risk of developing a clot.  After discharge, you should have regular check-up appointments with your healthcare provider that is prescribing your Eliquis.  What do you do if you miss a dose? If a dose of ELIQUIS is not taken at the scheduled time, take it as soon as possible on the same day and twice-daily administration should be resumed.  The dose should not be doubled to make up for a missed dose.  Do not take more than one tablet of ELIQUIS at the same time.  Important Safety Information A possible side effect of Eliquis is bleeding. You should call your healthcare provider right away if you experience any of the following: ? Bleeding from an  injury or your nose that does not stop. ? Unusual colored urine (red or dark brown) or unusual colored stools (red or black). ? Unusual bruising for unknown reasons. ? A serious fall or if you hit your head (even if there is no bleeding).  Some medicines may interact with Eliquis and might increase your risk of bleeding or clotting while on Eliquis. To help avoid this, consult your healthcare provider or pharmacist prior to using any new prescription or non-prescription medications, including herbals, vitamins, non-steroidal anti-inflammatory drugs (NSAIDs) and supplements.  This website has more information on Eliquis (apixaban): http://www.eliquis.com/eliquis/home

## 2016-05-30 NOTE — Progress Notes (Signed)
   Subjective:  Patient reports pain as mild to moderate.  No c/o.  Objective:   VITALS:   Vitals:   05/29/16 1930 05/29/16 2230 05/30/16 0210 05/30/16 0633  BP: (!) 121/58 (!) 105/55 (!) 109/57 117/68  Pulse: (!) 56 (!) 59 60 65  Resp: 17 18 18 18   Temp: 98 F (36.7 C) 97.5 F (36.4 C) 98.4 F (36.9 C) 98.2 F (36.8 C)  TempSrc: Oral Oral Oral Oral  SpO2: 100% 100% 99% 99%  Weight:      Height:        ABD soft Sensation intact distally Intact pulses distally Dorsiflexion/Plantar flexion intact Incision: dressing C/D/I Compartment soft   Lab Results  Component Value Date   WBC 14.3 (H) 05/30/2016   HGB 10.3 (L) 05/30/2016   HCT 31.1 (L) 05/30/2016   MCV 92.0 05/30/2016   PLT 284 05/30/2016   BMET    Component Value Date/Time   NA 139 05/30/2016 0433   NA 146 (H) 10/15/2015 1301   K 4.0 05/30/2016 0433   K 4.2 10/15/2015 1301   CL 107 05/30/2016 0433   CL 106 12/01/2012 1108   CO2 26 05/30/2016 0433   CO2 23 10/15/2015 1301   GLUCOSE 113 (H) 05/30/2016 0433   GLUCOSE 90 10/15/2015 1301   GLUCOSE 109 (H) 12/01/2012 1108   BUN 16 05/30/2016 0433   BUN 12.5 10/15/2015 1301   CREATININE 0.80 05/30/2016 0433   CREATININE 0.9 10/15/2015 1301   CALCIUM 8.5 (L) 05/30/2016 0433   CALCIUM 9.7 10/15/2015 1301   GFRNONAA >60 05/30/2016 0433   GFRAA >60 05/30/2016 0433     Assessment/Plan: 1 Day Post-Op   Principal Problem:   Primary osteoarthritis of left hip  WBAT with walker DVT ppx: apixaban, SCDs, TEDs PO pain control PT/OT Dispo: d/c home after clears therapy, no HHPT required, already has DME    Vern Prestia, Horald Pollen 05/30/2016, 7:08 AM   Rod Can, MD Cell [phone]

## 2016-05-30 NOTE — Evaluation (Signed)
Occupational Therapy Evaluation Patient Details Name: Colleen Moreno MRN: [id number] DOB: 01-08-1951 Today's Date: 05/30/2016    History of Present Illness s/p L DA THA   Clinical Impression   This 65 year old female was admitted for the above sx.   All education was completed. No further OT is needed at this time   Follow Up Recommendations  No OT follow up;Supervision/Assistance - 24 hour    Equipment Recommendations  3 in 1 bedside comode (ordered per pt)    Recommendations for Other Services       Precautions / Restrictions Precautions Precautions: Fall Restrictions Weight Bearing Restrictions: No Other Position/Activity Restrictions: WBAT      Mobility Bed Mobility               General bed mobility comments: oob  Transfers Overall transfer level: Needs assistance Equipment used: Rolling walker (2 wheeled) Transfers: Sit to/from Stand Sit to Stand: Min guard         General transfer comment: for safety.  No cues needed    Balance                                            ADL Overall ADL's : Needs assistance/impaired     Grooming: Oral care;Supervision/safety;Standing   Upper Body Bathing: Set up;Sitting   Lower Body Bathing: Minimal assistance;Sit to/from stand   Upper Body Dressing : Set up;Sitting   Lower Body Dressing: Minimal assistance;Sit to/from stand   Toilet Transfer: Min guard;Ambulation;BSC;RW   Toileting- Water quality scientist and Hygiene: Min guard;Sit to/from stand   Tub/ Shower Transfer: Walk-in shower;Min guard;Ambulation     General ADL Comments: ambulated to bathroom with min guard and performed ADL.  Reviewed working within pain tolerance, side stepping through tight spaces.     Vision     Perception     Praxis      Pertinent Vitals/Pain Pain Assessment: 0-10 Pain Score: 2  Pain Location: L hip Pain Descriptors / Indicators: Sore Pain Intervention(s): Limited activity within  patient's tolerance;Monitored during session;Premedicated before session;Repositioned     Hand Dominance     Extremity/Trunk Assessment Upper Extremity Assessment Upper Extremity Assessment: Overall WFL for tasks assessed           Communication Communication Communication: No difficulties   Cognition Arousal/Alertness: Awake/alert Behavior During Therapy: WFL for tasks assessed/performed Overall Cognitive Status: Within Functional Limits for tasks assessed                     General Comments       Exercises       Shoulder Instructions      Home Living Family/patient expects to be discharged to:: Private residence Living Arrangements: Spouse/significant other Available Help at Discharge: Family               Bathroom Shower/Tub: Occupational psychologist: Standard     Home Equipment: Shower seat - built in (3:1 ordered)          Prior Functioning/Environment Level of Independence: Independent             OT Diagnosis:   L DA THA; Generalized weakness    OT Problem List:     OT Treatment/Interventions:      OT Goals(Current goals can be found in the care plan section) Acute Rehab OT Goals Patient  Stated Goal: return to independence OT Goal Formulation: All assessment and education complete, DC therapy  OT Frequency:     Barriers to D/C:            Co-evaluation              End of Session    Activity Tolerance: Patient tolerated treatment well Patient left: in chair;with call bell/phone within reach   Time: 0910-0934 OT Time Calculation (min): 24 min Charges:  OT General Charges $OT Visit: 1 Procedure OT Evaluation $OT Eval Low Complexity: 1 Procedure OT Treatments $Self Care/Home Management : 8-22 mins G-Codes:    Johnanthony Wilden 06/02/16, 11:10 AM  Lesle Chris, OTR/L [phone] 06-02-2016

## 2016-06-12 DIAGNOSIS — Z96642 Presence of left artificial hip joint: Secondary | ICD-10-CM | POA: Diagnosis not present

## 2016-06-12 DIAGNOSIS — Z471 Aftercare following joint replacement surgery: Secondary | ICD-10-CM | POA: Diagnosis not present

## 2016-06-17 NOTE — Addendum Note (Signed)
Encounter addended by: Lesia Hausen, RN on: 06/17/2016  4:19 PM<BR>    Actions taken: Charge Capture section accepted

## 2016-06-17 NOTE — Addendum Note (Signed)
Encounter addended by: Lesia Hausen, RN on: 06/17/2016  5:14 PM<BR>    Actions taken: Charge Capture section accepted

## 2016-07-01 DIAGNOSIS — D225 Melanocytic nevi of trunk: Secondary | ICD-10-CM | POA: Diagnosis not present

## 2016-07-01 DIAGNOSIS — L814 Other melanin hyperpigmentation: Secondary | ICD-10-CM | POA: Diagnosis not present

## 2016-07-01 DIAGNOSIS — Z86018 Personal history of other benign neoplasm: Secondary | ICD-10-CM | POA: Diagnosis not present

## 2016-07-01 DIAGNOSIS — D1800 Hemangioma unspecified site: Secondary | ICD-10-CM | POA: Diagnosis not present

## 2016-07-01 DIAGNOSIS — L821 Other seborrheic keratosis: Secondary | ICD-10-CM | POA: Diagnosis not present

## 2016-07-01 DIAGNOSIS — Z23 Encounter for immunization: Secondary | ICD-10-CM | POA: Diagnosis not present

## 2016-07-02 DIAGNOSIS — Z961 Presence of intraocular lens: Secondary | ICD-10-CM | POA: Diagnosis not present

## 2016-07-02 DIAGNOSIS — H4302 Vitreous prolapse, left eye: Secondary | ICD-10-CM | POA: Diagnosis not present

## 2016-07-02 DIAGNOSIS — H43393 Other vitreous opacities, bilateral: Secondary | ICD-10-CM | POA: Diagnosis not present

## 2016-07-02 DIAGNOSIS — H04123 Dry eye syndrome of bilateral lacrimal glands: Secondary | ICD-10-CM | POA: Diagnosis not present

## 2016-07-10 DIAGNOSIS — Z471 Aftercare following joint replacement surgery: Secondary | ICD-10-CM | POA: Diagnosis not present

## 2016-07-10 DIAGNOSIS — Z95828 Presence of other vascular implants and grafts: Secondary | ICD-10-CM | POA: Diagnosis not present

## 2016-07-10 DIAGNOSIS — Z96642 Presence of left artificial hip joint: Secondary | ICD-10-CM | POA: Diagnosis not present

## 2016-07-14 ENCOUNTER — Other Ambulatory Visit: Payer: Self-pay | Admitting: Orthopedic Surgery

## 2016-07-14 DIAGNOSIS — Z86718 Personal history of other venous thrombosis and embolism: Secondary | ICD-10-CM

## 2016-07-17 ENCOUNTER — Other Ambulatory Visit: Payer: Self-pay | Admitting: Orthopedic Surgery

## 2016-07-17 ENCOUNTER — Ambulatory Visit
Admission: RE | Admit: 2016-07-17 | Discharge: 2016-07-17 | Disposition: A | Discharge status: Home / Self Care | Payer: Medicare Other | Source: Ambulatory Visit | Attending: Orthopedic Surgery | Admitting: Orthopedic Surgery

## 2016-07-17 DIAGNOSIS — Z86718 Personal history of other venous thrombosis and embolism: Secondary | ICD-10-CM

## 2016-07-17 DIAGNOSIS — Z95828 Presence of other vascular implants and grafts: Secondary | ICD-10-CM | POA: Diagnosis not present

## 2016-07-17 NOTE — Progress Notes (Signed)
Patient ID: Colleen Moreno, female   DOB: 08-10-1951, 65 y.o.   MRN: [id number]    Chief Complaint: Patient was seen in consultation today for No chief complaint on file.  at the request of [place]  Referring Physician(s): Swinteck,Brian  Supervising Physician: Marybelle Killings  Patient Status: [facility] - Out-pt  History of Present Illness: Colleen Moreno is a 65 y.o. female with a history of pulmonary embolism who had an IVC filter placed prophylactically for hip replacement surgery. She is off her anticoagulants and is completely ambulatory. IVC filter retrieval is indicated at this time if she desires.  Past Medical History:  Diagnosis Date  . Allergy   . Arthritis   . Breast cancer ([place])    left  . 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 CLL (chronic lymphoblastic leukemia)   . Clotting disorder ([place]) 2006   blood clot  . GERD (gastroesophageal reflux disease)   . Hyperlipidemia   . Pneumonia     Past Surgical History:  Procedure Laterality Date  . BILATERAL CARPAL TUNNEL RELEASE    . CARDIAC CATHETERIZATION  2004   right femoral  . CHOLECYSTECTOMY    . EYE SURGERY Bilateral    cataract extraction with IOL  . GALLBLADDER SURGERY  2003  . HERNIA REPAIR    . IR GENERIC HISTORICAL  05/14/2016   IR IVC FILTER PLMT / S&I Burke Keels GUID/MOD SED 05/14/2016 Aletta Edouard, MD [facility]-INTERV RAD  . IVC FILTER PLACEMENT ([facility] HX)  05/14/2016  . KNEE ARTHROSCOPY  2006  . MASTECTOMY PARTIAL / LUMPECTOMY  06/11/2011   Left lumpectomy  . THYROID SURGERY  1979  . TOTAL HIP ARTHROPLASTY Left 05/29/2016   Procedure: LEFT TOTAL HIP ARTHROPLASTY ANTERIOR APPROACH;  Surgeon: Rod Can, MD;  Location: [facility] ORS;  Service: Orthopedics;  Laterality: Left;    Allergies: Review of patient's allergies indicates no known allergies.  Medications: Prior to Admission medications   Medication Sig Start Date End Date Taking? Authorizing Provider  apixaban (ELIQUIS) 2.5 MG TABS tablet  Take 1 tablet (2.5 mg total) by mouth every 12 (twelve) hours. 05/30/16   Rod Can, MD  atorvastatin (LIPITOR) 40 MG tablet Take 40 mg by mouth daily.    Historical Provider, MD  cetirizine (ZYRTEC) 10 MG tablet Take 10 mg by mouth daily.    Historical Provider, MD  docusate sodium (COLACE) 100 MG capsule Take 1 capsule (100 mg total) by mouth 2 (two) times daily. 05/30/16   Rod Can, MD  HYDROcodone-acetaminophen (NORCO/VICODIN) 5-325 MG tablet Take 1-2 tablets by mouth every 4 (four) hours as needed (breakthrough pain). 05/30/16   Rod Can, MD  lansoprazole (PREVACID) 30 MG capsule Take 30 mg by mouth daily. 11/27/14   Historical Provider, MD  NASONEX 50 MCG/ACT nasal spray Inhale 2 sprays into the lungs daily as needed. Reported on 10/15/2015 11/27/14   Historical Provider, MD  ondansetron (ZOFRAN) 4 MG tablet Take 1 tablet (4 mg total) by mouth every 6 (six) hours as needed for nausea. 05/30/16   Rod Can, MD  senna (SENOKOT) 8.6 MG TABS tablet Take 2 tablets (17.2 mg total) by mouth at bedtime. 05/30/16   Rod Can, MD  venlafaxine XR (EFFEXOR-XR) 37.5 MG 24 hr capsule Take 37.5 mg by mouth daily.    Historical Provider, MD     Family History  Problem Relation Age of Onset  . Cancer Mother     some type uterus or cervixor ovary    Social History  Social History  . Marital status: Married    Spouse name: N/A  . Number of children: N/A  . Years of education: N/A   Social History Main Topics  . Smoking status: Former Smoker    Packs/day: 1.00    Years: 1.00    Types: Cigarettes    Quit date: 05/15/2015  . Smokeless tobacco: Never Used  . Alcohol use No  . Drug use: No  . Sexual activity: Yes   Other Topics Concern  . Not on file   Social History Narrative  . No narrative on file     Review of Systems: A 12 point ROS discussed and pertinent positives are indicated in the HPI above.  All other systems are negative.  Review of Systems  Vital  Signs: There were no vitals taken for this visit.  Physical Exam  Constitutional: She is oriented to person, place, and time. She appears well-developed and well-nourished.  HENT:  Head: Normocephalic and atraumatic.  Musculoskeletal: She exhibits no edema or deformity.  Neurological: She is alert and oriented to person, place, and time.  Skin: Skin is warm and dry.    Mallampati Score:     Imaging: No results found.  Labs:  CBC:  Recent Labs  10/15/15 1301 05/14/16 1254 05/20/16 1400 05/30/16 0433  WBC 14.7* 12.6* 14.0* 14.3*  HGB 15.4 14.2 13.6 10.3*  HCT 46.9* 42.6 40.2 31.1*  PLT 358 373 340 284    COAGS:  Recent Labs  05/14/16 1254  INR 0.93  APTT 26    BMP:  Recent Labs  10/15/15 1301 05/14/16 1254 05/30/16 0433  NA 146* 139 139  K 4.2 3.8 4.0  CL  --  109 107  CO2 23 22 26   GLUCOSE 90 96 113*  BUN 12.5 16 16   CALCIUM 9.7 9.3 8.5*  CREATININE 0.9 0.75 0.80  GFRNONAA  --  >60 >60  GFRAA  --  >60 >60    LIVER FUNCTION TESTS:  Recent Labs  10/15/15 1301  BILITOT 0.41  AST 10  ALT 11  ALKPHOS 90  PROT 7.2  ALBUMIN 4.3    TUMOR MARKERS: No results for input(s): AFPTM, CEA, CA199, CHROMGRNA in the last 8760 hours.  Assessment and Plan:  Colleen Moreno is now completely ambulatory and off anticoagulants. I discussed the procedure, risks, benefits, and alternatives to IVC filter retrieval. We also discussed making the filter prominent. She wishes to proceed with filter retrieval.   Electronically Signed: Malvern Kadlec, ART A 07/17/2016, 11:29 AM   I spent a total of    15 Minutes in face to face in clinical consultation, greater than 50% of which was counseling/coordinating care for IVC filter retrieval.

## 2016-07-27 ENCOUNTER — Other Ambulatory Visit: Payer: Self-pay | Admitting: Radiology

## 2016-07-29 ENCOUNTER — Ambulatory Visit (HOSPITAL_COMMUNITY)
Admission: RE | Admit: 2016-07-29 | Discharge: 2016-07-29 | Disposition: A | Discharge status: Home / Self Care | Payer: Medicare Other | Source: Ambulatory Visit | Attending: Orthopedic Surgery | Admitting: Orthopedic Surgery

## 2016-07-29 ENCOUNTER — Encounter (HOSPITAL_COMMUNITY): Payer: Self-pay

## 2016-07-29 DIAGNOSIS — Z96642 Presence of left artificial hip joint: Secondary | ICD-10-CM | POA: Insufficient documentation

## 2016-07-29 DIAGNOSIS — Z7901 Long term (current) use of anticoagulants: Secondary | ICD-10-CM | POA: Insufficient documentation

## 2016-07-29 DIAGNOSIS — E785 Hyperlipidemia, unspecified: Secondary | ICD-10-CM | POA: Insufficient documentation

## 2016-07-29 DIAGNOSIS — Z853 Personal history of malignant neoplasm of breast: Secondary | ICD-10-CM | POA: Insufficient documentation

## 2016-07-29 DIAGNOSIS — Z86711 Personal history of pulmonary embolism: Secondary | ICD-10-CM | POA: Diagnosis not present

## 2016-07-29 DIAGNOSIS — Z79899 Other long term (current) drug therapy: Secondary | ICD-10-CM | POA: Insufficient documentation

## 2016-07-29 DIAGNOSIS — Z4589 Encounter for adjustment and management of other implanted devices: Secondary | ICD-10-CM | POA: Insufficient documentation

## 2016-07-29 DIAGNOSIS — Z86718 Personal history of other venous thrombosis and embolism: Secondary | ICD-10-CM

## 2016-07-29 DIAGNOSIS — K219 Gastro-esophageal reflux disease without esophagitis: Secondary | ICD-10-CM | POA: Insufficient documentation

## 2016-07-29 DIAGNOSIS — Z856 Personal history of leukemia: Secondary | ICD-10-CM | POA: Diagnosis not present

## 2016-07-29 DIAGNOSIS — Z8679 Personal history of other diseases of the circulatory system: Secondary | ICD-10-CM | POA: Diagnosis not present

## 2016-07-29 HISTORY — PX: IR GENERIC HISTORICAL: IMG1180011

## 2016-07-29 LAB — BASIC METABOLIC PANEL
Anion gap: 10 (ref 5–15)
BUN: 18 mg/dL (ref 6–20)
CALCIUM: 9.3 mg/dL (ref 8.9–10.3)
CO2: 22 mmol/L (ref 22–32)
CREATININE: 0.77 mg/dL (ref 0.44–1.00)
Chloride: 107 mmol/L (ref 101–111)
GFR calc non Af Amer: >60 mL/min (ref >60)
Glucose, Bld: 111 mg/dL — ABNORMAL HIGH (ref 65–99)
Potassium: 4 mmol/L (ref 3.5–5.1)
SODIUM: 139 mmol/L (ref 135–145)

## 2016-07-29 LAB — CBC WITH DIFFERENTIAL/PLATELET
BASOS PCT: 0 %
Basophils Absolute: 0 10*3/uL (ref 0.0–0.1)
EOS ABS: 0.6 10*3/uL (ref 0.0–0.7)
EOS PCT: 4 %
HCT: 40.7 % (ref 36.0–46.0)
HEMOGLOBIN: 13.7 g/dL (ref 12.0–15.0)
LYMPHS ABS: 9.7 10*3/uL — AB (ref 0.7–4.0)
Lymphocytes Relative: 63 %
MCH: 30.4 pg (ref 26.0–34.0)
MCHC: 33.7 g/dL (ref 30.0–36.0)
MCV: 90.2 fL (ref 78.0–100.0)
MONO ABS: 0.9 10*3/uL (ref 0.1–1.0)
Monocytes Relative: 6 %
NEUTROS ABS: 4.2 10*3/uL (ref 1.7–7.7)
NEUTROS PCT: 27 %
PLATELETS: 432 10*3/uL — AB (ref 150–400)
RBC: 4.51 MIL/uL (ref 3.87–5.11)
RDW: 13.7 % (ref 11.5–15.5)
WBC: 15.4 10*3/uL — ABNORMAL HIGH (ref 4.0–10.5)

## 2016-07-29 LAB — PROTIME-INR
INR: 0.89
PROTHROMBIN TIME: 12 s (ref 11.4–15.2)

## 2016-07-29 MED ORDER — LIDOCAINE-EPINEPHRINE (PF) 2 %-1:200000 IJ SOLN
INTRAMUSCULAR | Status: AC
  Filled 2016-07-29: qty 20

## 2016-07-29 MED ORDER — IOPAMIDOL (ISOVUE-300) INJECTION 61%
100.0000 mL | Freq: Once | INTRAVENOUS | Status: AC | PRN
  Administered 2016-07-29: 40 mL via INTRAVENOUS

## 2016-07-29 MED ORDER — FENTANYL CITRATE (PF) 100 MCG/2ML IJ SOLN
INTRAMUSCULAR | Status: AC
  Filled 2016-07-29: qty 4

## 2016-07-29 MED ORDER — MIDAZOLAM HCL 2 MG/2ML IJ SOLN
INTRAMUSCULAR | Status: AC | PRN
  Administered 2016-07-29 (×4): 1 mg via INTRAVENOUS

## 2016-07-29 MED ORDER — SODIUM CHLORIDE 0.9 % IV SOLN
INTRAVENOUS | Status: DC
  Administered 2016-07-29: 08:00:00 via INTRAVENOUS

## 2016-07-29 MED ORDER — FENTANYL CITRATE (PF) 100 MCG/2ML IJ SOLN
INTRAMUSCULAR | Status: AC | PRN
  Administered 2016-07-29: 50 ug via INTRAVENOUS

## 2016-07-29 MED ORDER — MIDAZOLAM HCL 2 MG/2ML IJ SOLN
INTRAMUSCULAR | Status: AC
  Filled 2016-07-29: qty 6

## 2016-07-29 NOTE — Discharge Instructions (Signed)
Leave dressing on neck in place for 24 hours, then you may remove it and then you may shower.    Moderate Conscious Sedation, Adult, Care After These instructions provide you with information about caring for yourself after your procedure. Your health care provider may also give you more specific instructions. Your treatment has been planned according to current medical practices, but problems sometimes occur. Call your health care provider if you have any problems or questions after your procedure. What can I expect after the procedure? After your procedure, it is common:  To feel sleepy for several hours.  To feel clumsy and have poor balance for several hours.  To have poor judgment for several hours.  To vomit if you eat too soon. Follow these instructions at home: For at least 24 hours after the procedure:   Do not:  Participate in activities where you could fall or become injured.  Drive.  Use heavy machinery.  Drink alcohol.  Take sleeping pills or medicines that cause drowsiness.  Make important decisions or sign legal documents.  Take care of children on your own.  Rest. Eating and drinking  Follow the diet recommended by your health care provider.  If you vomit:  Drink water, juice, or soup when you can drink without vomiting.  Make sure you have little or no nausea before eating solid foods. General instructions  Have a responsible adult stay with you until you are awake and alert.  Take over-the-counter and prescription medicines only as told by your health care provider.  If you smoke, do not smoke without supervision.  Keep all follow-up visits as told by your health care provider. This is important. Contact a health care provider if:  You keep feeling nauseous or you keep vomiting.  You feel light-headed.  You develop a rash.  You have a fever. Get help right away if:  You have trouble breathing. This information is not intended to  replace advice given to you by your health care provider. Make sure you discuss any questions you have with your health care provider. Document Released: 06/22/2013 Document Revised: 02/04/2016 Document Reviewed: 12/22/2015 Elsevier Interactive Patient Education  2017 [place], Care After Refer to this sheet in the next few weeks. These instructions provide you with information on caring for yourself after your procedure. Your health care provider may also give you more specific instructions. Your treatment has been planned according to current medical practices, but problems sometimes occur. Call your health care provider if you have any problems or questions after your procedure. WHAT TO EXPECT AFTER THE PROCEDURE After your procedure, it is typical to have the following sensations:  Mild discomfort at the catheter insertion site. HOME CARE INSTRUCTIONS   Take all medicines exactly as directed.  Follow any prescribed diet.  Follow instructions regarding both rest and physical activity.  Drink more fluids for the first several days after the procedure in order to help flush dye from your kidneys. SEEK MEDICAL CARE IF:  You develop a rash.  You have fever not controlled by medicine. SEEK IMMEDIATE MEDICAL CARE IF:  There is pain, drainage, bleeding, redness, swelling, warmth or a red streak at the site of the IV tube.  The extremity where your IV tube was placed becomes discolored, numb, or cool.  You have difficulty breathing or shortness of breath.  You develop chest pain.  You have excessive dizziness or fainting. This information is not intended to replace advice given to you by your  health care provider. Make sure you discuss any questions you have with your health care provider. Document Released: 06/22/2013 Document Revised: 09/06/2013 Document Reviewed: 06/22/2013 Elsevier Interactive Patient Education  2017 [employer].

## 2016-07-29 NOTE — Procedures (Signed)
Successful fluoroscopic guided retrieval of IVC filter. Access: R IJ vein. EBL: None No immediate post procedural complications.  Jay Jaqualin Serpa, MD Pager #: [phone]      

## 2016-07-29 NOTE — H&P (Signed)
Referring Physician(s): Swinteck,Brian  Supervising Physician: Sandi Mariscal  Patient Status:  [facility]  Chief Complaint:  "I'm getting my filter out"  Subjective: Patient familiar to IR service from prior IVC filter placement in August 2017. She has a remote history of left breast cancer, CLL as well as PE in 2003. She underwent prophylactic IVC filter placement prior to left total hip arthroplasty on 05/29/16. She was seen in follow-up by Dr. Barbie Banner on 07/17/16 to assess for IVC filter removal and deemed an appropriate candidate for filter removal. Bilateral lower extremity venous Dopplers were negative. She is currently off anticoagulants and fully ambulatory. She presents again today for IVC filter removal. She is currently asymptomatic with exception of occasional cough from allergies. Past Medical History:  Diagnosis Date  . Allergy   . Arthritis   . Breast cancer ([place])    left  . 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 CLL (chronic lymphoblastic leukemia)   . Clotting disorder ([place]) 2006   blood clot  . GERD (gastroesophageal reflux disease)   . Hyperlipidemia   . Pneumonia    Past Surgical History:  Procedure Laterality Date  . BILATERAL CARPAL TUNNEL RELEASE    . CARDIAC CATHETERIZATION  2004   right femoral  . CHOLECYSTECTOMY    . EYE SURGERY Bilateral    cataract extraction with IOL  . GALLBLADDER SURGERY  2003  . HERNIA REPAIR    . IR GENERIC HISTORICAL  05/14/2016   IR IVC FILTER PLMT / S&I Burke Keels GUID/MOD SED 05/14/2016 Aletta Edouard, MD [facility]-INTERV RAD  . IVC FILTER PLACEMENT ([facility] HX)  05/14/2016  . KNEE ARTHROSCOPY  2006  . MASTECTOMY PARTIAL / LUMPECTOMY  06/11/2011   Left lumpectomy  . THYROID SURGERY  1979  . TOTAL HIP ARTHROPLASTY Left 05/29/2016   Procedure: LEFT TOTAL HIP ARTHROPLASTY ANTERIOR APPROACH;  Surgeon: Rod Can, MD;  Location: [facility];  Service: Orthopedics;  Laterality: Left;      Allergies: Patient has no known  allergies.  Medications: Prior to Admission medications   Medication Sig Start Date End Date Taking? Authorizing Provider  atorvastatin (LIPITOR) 40 MG tablet Take 40 mg by mouth daily.   Yes Historical Provider, MD  cetirizine (ZYRTEC) 10 MG tablet Take 10 mg by mouth daily.   Yes Historical Provider, MD  lansoprazole (PREVACID) 30 MG capsule Take 30 mg by mouth daily. 11/27/14  Yes Historical Provider, MD  apixaban (ELIQUIS) 2.5 MG TABS tablet Take 1 tablet (2.5 mg total) by mouth every 12 (twelve) hours. 05/30/16   Rod Can, MD  docusate sodium (COLACE) 100 MG capsule Take 1 capsule (100 mg total) by mouth 2 (two) times daily. 05/30/16   Rod Can, MD  HYDROcodone-acetaminophen (NORCO/VICODIN) 5-325 MG tablet Take 1-2 tablets by mouth every 4 (four) hours as needed (breakthrough pain). 05/30/16   Rod Can, MD  NASONEX 50 MCG/ACT nasal spray Inhale 2 sprays into the lungs daily as needed. Reported on 10/15/2015 11/27/14   Historical Provider, MD  ondansetron (ZOFRAN) 4 MG tablet Take 1 tablet (4 mg total) by mouth every 6 (six) hours as needed for nausea. 05/30/16   Rod Can, MD  senna (SENOKOT) 8.6 MG TABS tablet Take 2 tablets (17.2 mg total) by mouth at bedtime. 05/30/16   Rod Can, MD  venlafaxine XR (EFFEXOR-XR) 37.5 MG 24 hr capsule Take 37.5 mg by mouth daily.    Historical Provider, MD     Vital Signs: BP (!) 121/94  Pulse 76   Temp 97.9 F (36.6 C) (Oral)   Resp 18   SpO2 99%   Physical Exam patient awake, alert. Chest with few fine right basilar crackles, left clear. Heart with regular rate and rhythm. Abdomen soft, positive bowel sounds, nontender. Lower extremities -no edema.  Imaging: No results found.  Labs:  CBC:  Recent Labs  05/14/16 1254 05/20/16 1400 05/30/16 0433 07/29/16 0747  WBC 12.6* 14.0* 14.3* 15.4*  HGB 14.2 13.6 10.3* 13.7  HCT 42.6 40.2 31.1* 40.7  PLT 373 340 284 432*    COAGS:  Recent Labs  05/14/16 1254  07/29/16 0747  INR 0.93 0.89  APTT 26  --     BMP:  Recent Labs  10/15/15 1301 05/14/16 1254 05/30/16 0433 07/29/16 0747  NA 146* 139 139 139  K 4.2 3.8 4.0 4.0  CL  --  109 107 107  CO2 23 22 26 22   GLUCOSE 90 96 113* 111*  BUN 12.5 16 16 18   CALCIUM 9.7 9.3 8.5* 9.3  CREATININE 0.9 0.75 0.80 0.77  GFRNONAA  --  >60 >60 >60  GFRAA  --  >60 >60 >60    LIVER FUNCTION TESTS:  Recent Labs  10/15/15 1301  BILITOT 0.41  AST 10  ALT 11  ALKPHOS 90  PROT 7.2  ALBUMIN 4.3    Assessment and Plan: Pt with remote history of left breast cancer, CLL as well as PE in 2003. She underwent prophylactic IVC filter placement prior to left total hip arthroplasty on 05/29/16. She was seen in follow-up by Dr. Barbie Banner on 07/17/16 to assess for IVC filter removal and deemed an appropriate candidate for filter removal. Bilateral lower extremity venous Dopplers were negative 07/17/16. She is currently off anticoagulants and fully ambulatory. She presents again today for IVC filter removal. Details/risks of procedure, including but not limited to, internal bleeding, infection, injury to adjacent structures, inability to remove filter, discussed with patient and son with their understanding and consent.   Electronically Signed: D. Rowe Robert 07/29/2016, 8:57 AM   I spent a total of 20 minutes at the the patient's bedside AND on the patient's hospital floor or unit, greater than 50% of which was counseling/coordinating care for IVC filter removal

## 2016-08-22 DIAGNOSIS — H43393 Other vitreous opacities, bilateral: Secondary | ICD-10-CM | POA: Diagnosis not present

## 2016-08-22 DIAGNOSIS — H4302 Vitreous prolapse, left eye: Secondary | ICD-10-CM | POA: Diagnosis not present

## 2016-08-22 DIAGNOSIS — Z961 Presence of intraocular lens: Secondary | ICD-10-CM | POA: Diagnosis not present

## 2016-08-22 DIAGNOSIS — H04123 Dry eye syndrome of bilateral lacrimal glands: Secondary | ICD-10-CM | POA: Diagnosis not present

## 2016-08-27 DIAGNOSIS — H35373 Puckering of macula, bilateral: Secondary | ICD-10-CM | POA: Diagnosis not present

## 2016-08-27 DIAGNOSIS — H43813 Vitreous degeneration, bilateral: Secondary | ICD-10-CM | POA: Diagnosis not present

## 2016-10-13 NOTE — Assessment & Plan Note (Signed)
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 Breast examination done 10/15/2015 is normal  Chronic lymphocytic leukemia CLL stage I: Patient had small lymphadenopathy that has been fairly stable. Absolute lymphocyte count today is extremely stable. Patient does not have any evidence of anemia or thrombocytopenia or any other symptoms or signs of progression of CLL. Continue annual follow-up with blood counts.  Depression: On Effexor XR once daily to help her symptoms.  RTC 1 year for follow up. She doesnot want to go to survivorship clinic.

## 2016-10-14 ENCOUNTER — Ambulatory Visit (HOSPITAL_BASED_OUTPATIENT_CLINIC_OR_DEPARTMENT_OTHER): Payer: Medicare Other | Admitting: Hematology and Oncology

## 2016-10-14 ENCOUNTER — Encounter: Payer: Self-pay | Admitting: Hematology and Oncology

## 2016-10-14 ENCOUNTER — Other Ambulatory Visit (HOSPITAL_BASED_OUTPATIENT_CLINIC_OR_DEPARTMENT_OTHER): Payer: Medicare Other

## 2016-10-14 DIAGNOSIS — D0512 Intraductal carcinoma in situ of left breast: Secondary | ICD-10-CM

## 2016-10-14 DIAGNOSIS — C9110 Chronic lymphocytic leukemia of B-cell type not having achieved remission: Secondary | ICD-10-CM | POA: Diagnosis not present

## 2016-10-14 DIAGNOSIS — Z170 Estrogen receptor positive status [ER+]: Principal | ICD-10-CM

## 2016-10-14 DIAGNOSIS — F329 Major depressive disorder, single episode, unspecified: Secondary | ICD-10-CM | POA: Diagnosis not present

## 2016-10-14 DIAGNOSIS — C50512 Malignant neoplasm of lower-outer quadrant of left female breast: Secondary | ICD-10-CM

## 2016-10-14 LAB — COMPREHENSIVE METABOLIC PANEL
ALT: 12 U/L (ref 0–55)
ANION GAP: 11 meq/L (ref 3–11)
AST: 12 U/L (ref 5–34)
Albumin: 4.4 g/dL (ref 3.5–5.0)
Alkaline Phosphatase: 114 U/L (ref 40–150)
BILIRUBIN TOTAL: 0.81 mg/dL (ref 0.20–1.20)
BUN: 14.8 mg/dL (ref 7.0–26.0)
CO2: 24 meq/L (ref 22–29)
Calcium: 10.1 mg/dL (ref 8.4–10.4)
Chloride: 106 mEq/L (ref 98–109)
Creatinine: 0.9 mg/dL (ref 0.6–1.1)
EGFR: 68 mL/min/{1.73_m2} — ABNORMAL LOW (ref >90)
Glucose: 96 mg/dl (ref 70–140)
POTASSIUM: 4.6 meq/L (ref 3.5–5.1)
Sodium: 141 mEq/L (ref 136–145)
TOTAL PROTEIN: 7.1 g/dL (ref 6.4–8.3)

## 2016-10-14 LAB — CBC WITH DIFFERENTIAL/PLATELET
BASO%: 1.1 % (ref 0.0–2.0)
BASOS ABS: 0.1 10*3/uL (ref 0.0–0.1)
EOS%: 3.2 % (ref 0.0–7.0)
Eosinophils Absolute: 0.4 10*3/uL (ref 0.0–0.5)
HCT: 45 % (ref 34.8–46.6)
HGB: 14.6 g/dL (ref 11.6–15.9)
LYMPH%: 64.6 % — AB (ref 14.0–49.7)
MCH: 28.8 pg (ref 25.1–34.0)
MCHC: 32.6 g/dL (ref 31.5–36.0)
MCV: 88.5 fL (ref 79.5–101.0)
MONO#: 0.7 10*3/uL (ref 0.1–0.9)
MONO%: 5.7 % (ref 0.0–14.0)
NEUT%: 25.4 % — AB (ref 38.4–76.8)
NEUTROS ABS: 3 10*3/uL (ref 1.5–6.5)
PLATELETS: 404 10*3/uL — AB (ref 145–400)
RBC: 5.08 10*6/uL (ref 3.70–5.45)
RDW: 14.2 % (ref 11.2–14.5)
WBC: 12 10*3/uL — ABNORMAL HIGH (ref 3.9–10.3)
lymph#: 7.8 10*3/uL — ABNORMAL HIGH (ref 0.9–3.3)

## 2016-10-14 LAB — LACTATE DEHYDROGENASE: LDH: 150 U/L (ref 125–245)

## 2016-10-14 LAB — TECHNOLOGIST REVIEW

## 2016-10-14 NOTE — Progress Notes (Signed)
Patient Care Team: Orpah Melter, MD as PCP - General (Family Medicine) Marcy Panning, MD (Internal Medicine) Gery Pray, MD (Radiation Oncology) Neldon Mc, MD as Surgeon (General Surgery) Dian Queen, MD (Obstetrics and Gynecology)  DIAGNOSIS:  Encounter Diagnosis  Name Primary?  . Malignant neoplasm of lower-outer quadrant of left breast of female, estrogen receptor positive ([place])     SUMMARY OF ONCOLOGIC HISTORY:   Breast cancer of lower-outer quadrant of left female breast ([place])   05/28/2011 Initial Diagnosis    DCIS left breast ER/PR positive      06/11/2011 Surgery    Left breast lumpectomy: High-grade DCIS, ER 76%, PR 86%      07/11/2011 - 08/11/2011 Radiation Therapy    Adjuvant radiation therapy      08/14/2011 - 02/10/2012 Anti-estrogen oral therapy    Exemestane 25 mg daily caused severe weight loss and discontinued       CHIEF COMPLIANT: Follow-up of CLL and breast cancer  INTERVAL HISTORY: Colleen Moreno is a 66 year old with above-mentioned history of DCIS left breast treated with lumpectomy and radiation and took exemestane for a short period and discontinued it. She also has CLL which has been very stable. She reports that she is feeling healthy and without any major problems or concerns. She lives in the area near [place].  REVIEW OF SYSTEMS:   Constitutional: Denies fevers, chills or abnormal weight loss Eyes: Denies blurriness of vision Ears, nose, mouth, throat, and face: Denies mucositis or sore throat Respiratory: Denies cough, dyspnea or wheezes Cardiovascular: Denies palpitation, chest discomfort Gastrointestinal:  Denies nausea, heartburn or change in bowel habits Skin: Denies abnormal skin rashes Lymphatics: Denies new lymphadenopathy or easy bruising Neurological:Denies numbness, tingling or new weaknesses Behavioral/Psych: Mood is stable, no new changes  Extremities: No lower extremity edema Breast:  denies any pain or  lumps or nodules in either breasts All other systems were reviewed with the patient and are negative.  I have reviewed the past medical history, past surgical history, social history and family history with the patient and they are unchanged from previous note.  ALLERGIES:  has No Known Allergies.  MEDICATIONS:  Current Outpatient Prescriptions  Medication Sig Dispense Refill  . apixaban (ELIQUIS) 2.5 MG TABS tablet Take 1 tablet (2.5 mg total) by mouth every 12 (twelve) hours. 60 tablet 0  . atorvastatin (LIPITOR) 40 MG tablet Take 40 mg by mouth daily.    . cetirizine (ZYRTEC) 10 MG tablet Take 10 mg by mouth daily.    Marland Kitchen docusate sodium (COLACE) 100 MG capsule Take 1 capsule (100 mg total) by mouth 2 (two) times daily. 60 capsule 3  . HYDROcodone-acetaminophen (NORCO/VICODIN) 5-325 MG tablet Take 1-2 tablets by mouth every 4 (four) hours as needed (breakthrough pain). 80 tablet 0  . lansoprazole (PREVACID) 30 MG capsule Take 30 mg by mouth daily.  3  . NASONEX 50 MCG/ACT nasal spray Inhale 2 sprays into the lungs daily as needed. Reported on 10/15/2015  3  . ondansetron (ZOFRAN) 4 MG tablet Take 1 tablet (4 mg total) by mouth every 6 (six) hours as needed for nausea. 20 tablet 0  . senna (SENOKOT) 8.6 MG TABS tablet Take 2 tablets (17.2 mg total) by mouth at bedtime. 60 each 3  . venlafaxine XR (EFFEXOR-XR) 37.5 MG 24 hr capsule Take 37.5 mg by mouth daily.     No current facility-administered medications for this visit.     PHYSICAL EXAMINATION: ECOG PERFORMANCE STATUS: 0 - Asymptomatic  Vitals:   10/14/16 1321  BP: (!) 146/81  Pulse: 78  Resp: 18  Temp: 97.9 F (36.6 C)   Filed Weights   10/14/16 1321  Weight: 180 lb 4.8 oz (81.8 kg)    GENERAL:alert, no distress and comfortable SKIN: skin color, texture, turgor are normal, no rashes or significant lesions EYES: normal, Conjunctiva are pink and non-injected, sclera clear OROPHARYNX:no exudate, no erythema and lips,  buccal mucosa, and tongue normal  NECK: supple, thyroid normal size, non-tender, without nodularity LYMPH:  no palpable lymphadenopathy in the cervical, axillary or inguinal LUNGS: clear to auscultation and percussion with normal breathing effort HEART: regular rate & rhythm and no murmurs and no lower extremity edema ABDOMEN:abdomen soft, non-tender and normal bowel sounds MUSCULOSKELETAL:no cyanosis of digits and no clubbing  NEURO: alert & oriented x 3 with fluent speech, no focal motor/sensory deficits EXTREMITIES: No lower extremity edema BREAST: No palpable masses or nodules in either right or left breasts. No palpable axillary supraclavicular or infraclavicular adenopathy no breast tenderness or nipple discharge. (exam performed in the presence of a chaperone)  LABORATORY DATA:  I have reviewed the data as listed   Chemistry      Component Value Date/Time   NA 139 07/29/2016 0747   NA 146 (H) 10/15/2015 1301   K 4.0 07/29/2016 0747   K 4.2 10/15/2015 1301   CL 107 07/29/2016 0747   CL 106 12/01/2012 1108   CO2 22 07/29/2016 0747   CO2 23 10/15/2015 1301   BUN 18 07/29/2016 0747   BUN 12.5 10/15/2015 1301   CREATININE 0.77 07/29/2016 0747   CREATININE 0.9 10/15/2015 1301      Component Value Date/Time   CALCIUM 9.3 07/29/2016 0747   CALCIUM 9.7 10/15/2015 1301   ALKPHOS 90 10/15/2015 1301   AST 10 10/15/2015 1301   ALT 11 10/15/2015 1301   BILITOT 0.41 10/15/2015 1301       Lab Results  Component Value Date   WBC 12.0 (H) 10/14/2016   HGB 14.6 10/14/2016   HCT 45.0 10/14/2016   MCV 88.5 10/14/2016   PLT 404 (H) 10/14/2016   NEUTROABS 3.0 10/14/2016    ASSESSMENT & PLAN:  Breast cancer of lower-outer quadrant of left female breast ([place]) 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 Mammograms done in July 2017 2. Breast examination done 10/15/2015 is normal  Chronic lymphocytic leukemia CLL stage I: Patient had small lymphadenopathy that has been fairly stable. Absolute lymphocyte count today is extremely stable. Patient does not have any evidence of anemia or thrombocytopenia or any other symptoms or signs of progression of CLL.  Depression: On Effexor XR once daily  RTC 2 years for follow up. She doesnot want to go to survivorship clinic.  I spent 15 minutes talking to the patient of which more than half was spent in counseling and coordination of care.  No orders of the defined types were placed in this encounter.  The patient has a good understanding of the overall plan. she agrees with it. she will call with any problems that may develop before the next visit here.   Rulon Eisenmenger, MD 10/14/16

## 2016-10-22 ENCOUNTER — Telehealth: Payer: Self-pay | Admitting: Hematology and Oncology

## 2016-10-22 NOTE — Telephone Encounter (Signed)
Spoke with patient about her upcoming appointment and it needs to be in 2020 not in 2019.  Dr Lindi Adie told patient every 2 years

## 2016-10-31 DIAGNOSIS — K219 Gastro-esophageal reflux disease without esophagitis: Secondary | ICD-10-CM | POA: Diagnosis not present

## 2016-10-31 DIAGNOSIS — J019 Acute sinusitis, unspecified: Secondary | ICD-10-CM | POA: Diagnosis not present

## 2016-10-31 DIAGNOSIS — E782 Mixed hyperlipidemia: Secondary | ICD-10-CM | POA: Diagnosis not present

## 2016-10-31 DIAGNOSIS — F419 Anxiety disorder, unspecified: Secondary | ICD-10-CM | POA: Diagnosis not present

## 2016-10-31 DIAGNOSIS — C9110 Chronic lymphocytic leukemia of B-cell type not having achieved remission: Secondary | ICD-10-CM | POA: Diagnosis not present

## 2016-11-11 DIAGNOSIS — Z471 Aftercare following joint replacement surgery: Secondary | ICD-10-CM | POA: Diagnosis not present

## 2016-11-11 DIAGNOSIS — Z96642 Presence of left artificial hip joint: Secondary | ICD-10-CM | POA: Diagnosis not present

## 2016-12-31 DIAGNOSIS — J069 Acute upper respiratory infection, unspecified: Secondary | ICD-10-CM | POA: Diagnosis not present

## 2016-12-31 DIAGNOSIS — R05 Cough: Secondary | ICD-10-CM | POA: Diagnosis not present

## 2016-12-31 DIAGNOSIS — M7061 Trochanteric bursitis, right hip: Secondary | ICD-10-CM | POA: Diagnosis not present

## 2017-04-20 ENCOUNTER — Other Ambulatory Visit: Payer: Self-pay | Admitting: Hematology and Oncology

## 2017-04-20 ENCOUNTER — Other Ambulatory Visit: Payer: Self-pay | Admitting: Obstetrics and Gynecology

## 2017-04-20 DIAGNOSIS — Z853 Personal history of malignant neoplasm of breast: Secondary | ICD-10-CM

## 2017-05-29 DIAGNOSIS — Z23 Encounter for immunization: Secondary | ICD-10-CM | POA: Diagnosis not present

## 2017-06-08 ENCOUNTER — Ambulatory Visit
Admission: RE | Admit: 2017-06-08 | Discharge: 2017-06-08 | Disposition: A | Discharge status: Home / Self Care | Payer: Medicare Other | Source: Ambulatory Visit | Attending: Obstetrics and Gynecology | Admitting: Obstetrics and Gynecology

## 2017-06-08 DIAGNOSIS — R922 Inconclusive mammogram: Secondary | ICD-10-CM | POA: Diagnosis not present

## 2017-06-08 DIAGNOSIS — Z853 Personal history of malignant neoplasm of breast: Secondary | ICD-10-CM

## 2017-06-09 DIAGNOSIS — Z1211 Encounter for screening for malignant neoplasm of colon: Secondary | ICD-10-CM | POA: Diagnosis not present

## 2017-06-09 DIAGNOSIS — Z86010 Personal history of colonic polyps: Secondary | ICD-10-CM | POA: Diagnosis not present

## 2017-06-09 DIAGNOSIS — K5730 Diverticulosis of large intestine without perforation or abscess without bleeding: Secondary | ICD-10-CM | POA: Diagnosis not present

## 2017-06-09 DIAGNOSIS — R194 Change in bowel habit: Secondary | ICD-10-CM | POA: Diagnosis not present

## 2017-06-09 DIAGNOSIS — K5904 Chronic idiopathic constipation: Secondary | ICD-10-CM | POA: Diagnosis not present

## 2017-06-10 DIAGNOSIS — Z471 Aftercare following joint replacement surgery: Secondary | ICD-10-CM | POA: Diagnosis not present

## 2017-06-10 DIAGNOSIS — M25552 Pain in left hip: Secondary | ICD-10-CM | POA: Diagnosis not present

## 2017-06-10 DIAGNOSIS — G8929 Other chronic pain: Secondary | ICD-10-CM | POA: Diagnosis not present

## 2017-06-10 DIAGNOSIS — Z96642 Presence of left artificial hip joint: Secondary | ICD-10-CM | POA: Diagnosis not present

## 2017-06-12 DIAGNOSIS — D127 Benign neoplasm of rectosigmoid junction: Secondary | ICD-10-CM | POA: Diagnosis not present

## 2017-06-12 DIAGNOSIS — D128 Benign neoplasm of rectum: Secondary | ICD-10-CM | POA: Diagnosis not present

## 2017-06-12 DIAGNOSIS — Z1211 Encounter for screening for malignant neoplasm of colon: Secondary | ICD-10-CM | POA: Diagnosis not present

## 2017-06-12 DIAGNOSIS — R194 Change in bowel habit: Secondary | ICD-10-CM | POA: Diagnosis not present

## 2017-06-12 DIAGNOSIS — K635 Polyp of colon: Secondary | ICD-10-CM | POA: Diagnosis not present

## 2017-06-12 DIAGNOSIS — K6389 Other specified diseases of intestine: Secondary | ICD-10-CM | POA: Diagnosis not present

## 2017-10-16 ENCOUNTER — Other Ambulatory Visit: Payer: Self-pay

## 2017-10-16 DIAGNOSIS — C50512 Malignant neoplasm of lower-outer quadrant of left female breast: Secondary | ICD-10-CM

## 2017-10-16 DIAGNOSIS — Z170 Estrogen receptor positive status [ER+]: Principal | ICD-10-CM

## 2017-10-19 ENCOUNTER — Ambulatory Visit: Payer: Medicare Other | Admitting: Hematology and Oncology

## 2017-10-19 ENCOUNTER — Other Ambulatory Visit: Payer: Medicare Other

## 2017-10-19 NOTE — Assessment & Plan Note (Deleted)
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 Mammograms done in 05/19/2017: Benign 2. Breast examination done 10/19/2017 is normal

## 2017-10-21 ENCOUNTER — Ambulatory Visit: Payer: Medicare Other | Admitting: Hematology and Oncology

## 2017-10-21 ENCOUNTER — Other Ambulatory Visit: Payer: Medicare Other

## 2017-11-02 DIAGNOSIS — B029 Zoster without complications: Secondary | ICD-10-CM | POA: Diagnosis not present

## 2017-11-04 DIAGNOSIS — H9202 Otalgia, left ear: Secondary | ICD-10-CM | POA: Diagnosis not present

## 2017-11-04 DIAGNOSIS — B029 Zoster without complications: Secondary | ICD-10-CM | POA: Diagnosis not present

## 2017-11-10 DIAGNOSIS — B028 Zoster with other complications: Secondary | ICD-10-CM | POA: Diagnosis not present

## 2017-11-13 DIAGNOSIS — H9202 Otalgia, left ear: Secondary | ICD-10-CM | POA: Diagnosis not present

## 2017-11-13 DIAGNOSIS — B0229 Other postherpetic nervous system involvement: Secondary | ICD-10-CM | POA: Diagnosis not present

## 2017-11-25 ENCOUNTER — Telehealth: Payer: Self-pay | Admitting: Hematology and Oncology

## 2017-11-25 NOTE — Telephone Encounter (Signed)
MAILED RECORDS TO [place]

## 2017-12-04 DIAGNOSIS — C9190 Lymphoid leukemia, unspecified not having achieved remission: Secondary | ICD-10-CM | POA: Diagnosis not present

## 2017-12-04 DIAGNOSIS — K219 Gastro-esophageal reflux disease without esophagitis: Secondary | ICD-10-CM | POA: Diagnosis not present

## 2017-12-04 DIAGNOSIS — R12 Heartburn: Secondary | ICD-10-CM | POA: Diagnosis not present

## 2017-12-04 DIAGNOSIS — E782 Mixed hyperlipidemia: Secondary | ICD-10-CM | POA: Diagnosis not present

## 2017-12-04 DIAGNOSIS — Z853 Personal history of malignant neoplasm of breast: Secondary | ICD-10-CM | POA: Diagnosis not present

## 2017-12-04 DIAGNOSIS — Z86711 Personal history of pulmonary embolism: Secondary | ICD-10-CM | POA: Diagnosis not present

## 2017-12-04 DIAGNOSIS — M064 Inflammatory polyarthropathy: Secondary | ICD-10-CM | POA: Diagnosis not present

## 2017-12-04 DIAGNOSIS — Z8679 Personal history of other diseases of the circulatory system: Secondary | ICD-10-CM | POA: Diagnosis not present

## 2017-12-29 DIAGNOSIS — C9190 Lymphoid leukemia, unspecified not having achieved remission: Secondary | ICD-10-CM | POA: Diagnosis not present

## 2018-01-05 DIAGNOSIS — S92902A Unspecified fracture of left foot, initial encounter for closed fracture: Secondary | ICD-10-CM | POA: Diagnosis not present

## 2018-01-05 DIAGNOSIS — S92342A Displaced fracture of fourth metatarsal bone, left foot, initial encounter for closed fracture: Secondary | ICD-10-CM | POA: Diagnosis not present

## 2018-01-05 DIAGNOSIS — M19072 Primary osteoarthritis, left ankle and foot: Secondary | ICD-10-CM | POA: Diagnosis not present

## 2018-01-05 DIAGNOSIS — S92355A Nondisplaced fracture of fifth metatarsal bone, left foot, initial encounter for closed fracture: Secondary | ICD-10-CM | POA: Diagnosis not present

## 2018-01-05 DIAGNOSIS — M2142 Flat foot [pes planus] (acquired), left foot: Secondary | ICD-10-CM | POA: Diagnosis not present

## 2018-01-05 DIAGNOSIS — W19XXXA Unspecified fall, initial encounter: Secondary | ICD-10-CM | POA: Diagnosis not present

## 2018-01-05 DIAGNOSIS — M79672 Pain in left foot: Secondary | ICD-10-CM | POA: Diagnosis not present

## 2018-01-13 DIAGNOSIS — Z09 Encounter for follow-up examination after completed treatment for conditions other than malignant neoplasm: Secondary | ICD-10-CM | POA: Diagnosis not present

## 2018-05-20 IMAGING — US IR IVC FILTER RETRIEVAL / S&I /IMG GUID/MOD SED
1 series · 2 of 2 positions shown · non-contrast
Comparison: IVC filter placement - 05/14/2016

INDICATION: History of prior lower extremity DVT post prophylactic IVC filter
placement prior to left hip arthroplasty on 05/14/2016 by Dr.
Joulnar.

Please perform refer to formal consultation documented in the [REDACTED]
system by Dr. Hilal Nur on 07/17/2016.
EXAM:
1. IR IVC FILTER RETRIEVAL/S+I/ IMAGE GUIDE MODERATE SEDATION
2. IR ULTRASOUND GUIDANCE VASC ACCESS
TECHNIQUE: Informed written consent was obtained from the patient after a
discussion of the risks, benefits and alternatives to treatment.
Questions regarding the procedure were encouraged and answered. A
timeout was performed prior to the initiation of the procedure.

[Series 1: ir (id) (id)/(id)/(id) ir · 2 of 2 slices shown]
[im 1/2]
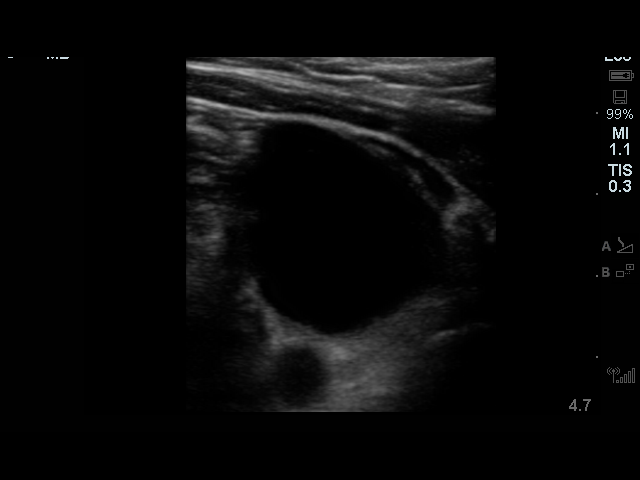
[im 2/2]
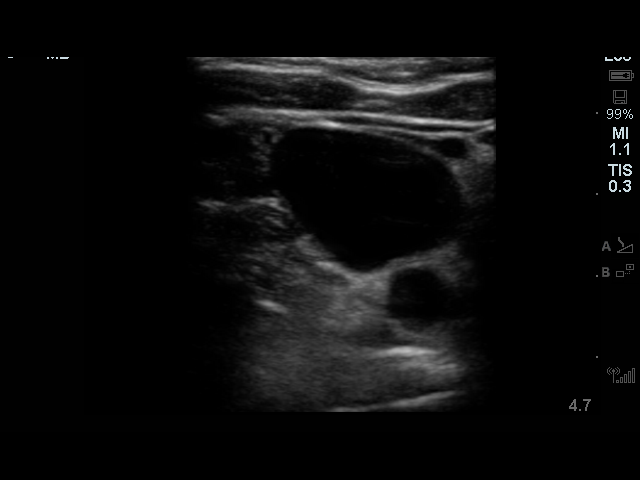

[2 of 2 positions shown; findings below may reference images not displayed]

MEDICATIONS:
None.

ANESTHESIA/SEDATION:
Moderate (conscious) sedation was employed during this procedure. A
total of Versed 4 mg and Fentanyl 50 mcg was administered
intravenously.

Moderate Sedation Time: 20 minutes. The patient's level of
consciousness and vital signs were monitored continuously by
radiology nursing throughout the procedure under my direct
supervision.

CONTRAST:  40 cc Isovue 300

COMPLICATIONS:
None immediate
The right neck was prepped and draped in the usual sterile fashion,
and a sterile drape was applied covering the operative field.
Maximum barrier sterile technique with sterile gowns and gloves were
used for the procedure. A timeout was performed prior to the
initiation of the procedure. Local anesthesia was provided with 1%
lidocaine.

Under direct ultrasound guidance, the right internal jugular vein
was accessed with a micropuncture needle at the overlying soft
tissues were anesthetized with 1% lidocaine. An ultrasound image was
saved for documentation purposes. This allowed for placement of a
5-French vascular sheath. With the use of a Benson wire, a pigtail
catheter was advanced to the level of the caudal IVC and an inferior
venacavagram was performed.

The vascular sheath was exchanged for an 11 [place] IVC filter
retrieval sheath. The hook of the filter was successfully snared and
the filter was withdrawn intact into the co-axial 11-French sheath.
A completion inferior venacavagram was performed.

At this point, the procedure was terminated. All wires, catheters
and sheaths were removed from the patient. Hemostasis was achieved
at the right neck access site with manual compression. A dressing
was placed. The patient tolerated the above procedure well without
immediate postprocedural complication.
FINDINGS: Inferior venacavagram demonstrates wide patency of the IVC. There is
no thrombus within the infrarenal IVC filter which appears unchanged
in position from the time of initial placement.

The IVC filter was successfully removed using a snare device as
detailed above.

Completion inferior venacavagram was negative for caval injury.
IMPRESSION: 1. Successful fluoroscopic guided removal of infrarenal IVC filter.

2. Normal inferior venacavagram.

## 2018-10-15 ENCOUNTER — Ambulatory Visit: Payer: Medicare Other | Admitting: Hematology and Oncology

## 2018-10-15 ENCOUNTER — Other Ambulatory Visit: Payer: Medicare Other
